# Patient Record
Sex: Female | Born: 2000 | Race: Black or African American | Hispanic: No | Marital: Single | Smoking: Never smoker
Health system: Southern US, Community
[De-identification: ages and names within clinical notes are randomized; demographics above are authoritative.]

## PROBLEM LIST (undated history)

## (undated) DIAGNOSIS — I10 Essential (primary) hypertension: Secondary | ICD-10-CM

## (undated) DIAGNOSIS — T7840XA Allergy, unspecified, initial encounter: Secondary | ICD-10-CM

## (undated) HISTORY — PX: NO PAST SURGERIES: SHX2092

---
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
## 2021-01-30 ENCOUNTER — Telehealth (INDEPENDENT_AMBULATORY_CARE_PROVIDER_SITE_OTHER): Payer: BC Managed Care – PPO | Admitting: Gastroenterology

## 2021-01-30 DIAGNOSIS — Z91199 Patient's noncompliance with other medical treatment and regimen due to unspecified reason: Secondary | ICD-10-CM

## 2021-01-30 DIAGNOSIS — Z5329 Procedure and treatment not carried out because of patient's decision for other reasons: Secondary | ICD-10-CM

## 2021-01-30 NOTE — Progress Notes (Signed)
Pt not answering phone and has not logged on for her visit. Voicemail was left for her to call us back

## 2021-03-05 ENCOUNTER — Ambulatory Visit
Admission: EM | Admit: 2021-03-05 | Discharge: 2021-03-05 | Disposition: A | Discharge status: Home / Self Care | Payer: BC Managed Care – PPO | Attending: Internal Medicine | Admitting: Internal Medicine

## 2021-03-05 ENCOUNTER — Encounter: Payer: Self-pay | Admitting: Emergency Medicine

## 2021-03-05 ENCOUNTER — Other Ambulatory Visit: Payer: Self-pay

## 2021-03-05 DIAGNOSIS — R000 Tachycardia, unspecified: Secondary | ICD-10-CM | POA: Diagnosis not present

## 2021-03-05 DIAGNOSIS — R42 Dizziness and giddiness: Secondary | ICD-10-CM | POA: Diagnosis not present

## 2021-03-05 DIAGNOSIS — R1110 Vomiting, unspecified: Secondary | ICD-10-CM

## 2021-03-05 DIAGNOSIS — R002 Palpitations: Secondary | ICD-10-CM

## 2021-03-05 NOTE — ED Triage Notes (Signed)
Patient c/o dizziness, blurred vision in her right eye, elevated heart rate x 1 week. She states she vomited twice today.

## 2021-03-05 NOTE — ED Provider Notes (Signed)
[facility]    CSN: [id number] Arrival date & time: 03/05/21  1555      History   Chief Complaint Chief Complaint  Patient presents with  . Dizziness  . Blurred Vision  . Tachycardia    HPI Kristie Morrow is a 20 y.o. female who presents  Due to havein dizziness and blurred vision in her R eye, her pulse was elevated off and on for an entire day x 1 week. There are days She vomited x 2 today after an episode this am. She does get a HA with this.  Pulse goes up from 110-115 while resting and during this time feels dizzy. There have been days up to a couple is gone. LMP 3 weeks ago Has been on Omeprazole x 2 months    Past Medical History:  Diagnosis Date  . Allergy    SEASONAL     Patient Active Problem List   Diagnosis Date Noted  . Gastroesophageal reflux disease   . Esophageal dysphagia   . Acute esophagitis   . Osgood-Schlatter's disease of right lower extremity 11/02/2018    Past Surgical History:  Procedure Laterality Date  . ESOPHAGOGASTRODUODENOSCOPY (EGD) WITH PROPOFOL N/A 11/08/2020   Procedure: ESOPHAGOGASTRODUODENOSCOPY (EGD) WITH PROPOFOL;  Surgeon: Pasty Spillers, MD;  Location: [facility] ENDOSCOPY;  Service: Endoscopy;  Laterality: N/A;  . EXCISION OF ACCESSORY OSSICLE Right 04/21/2019   Procedure: EXCISION OF A SYMPTOMATIC OSSICLE OF TIBIAL TUBERCLE WITH POSSIBLE REPAIR OF PATELLA TENDONOSSICLE RIGHT;  Surgeon: Christena Flake, MD;  Location: [facility] ORS;  Service: Orthopedics;  Laterality: Right;  . NO PAST SURGERIES      OB History   No obstetric history on file.      Home Medications    Prior to Admission medications   Medication Sig Start Date End Date Taking? Authorizing Provider  omeprazole (PRILOSEC) 40 MG capsule Take 1 capsule (40 mg total) by mouth daily. 11/15/20  Yes Tahiliani, Dolphus Jenny, MD  albuterol (VENTOLIN HFA) 108 (90 Base) MCG/ACT inhaler Inhale 1-2 puffs into the lungs every 6 (six) hours as needed for wheezing or  shortness of breath.  03/05/21  [provider]  montelukast (SINGULAIR) 5 MG chewable tablet Chew 5 mg by mouth at bedtime.  03/05/21  [provider]    Family History History reviewed. No pertinent family history.  Social History Social History   Tobacco Use  . Smoking status: Never Smoker  . Smokeless tobacco: Never Used  Vaping Use  . Vaping Use: Never used  Substance Use Topics  . Alcohol use: No  . Drug use: Never     Allergies   Patient has no known allergies.   Review of Systems Review of Systems  Constitutional: Negative for appetite change, chills, diaphoresis, fatigue and fever.  HENT: Negative for congestion.   Eyes: Negative for discharge.  Respiratory: Negative for cough, chest tightness and shortness of breath.   Cardiovascular: Positive for palpitations. Negative for chest pain.  Gastrointestinal: Positive for vomiting. Negative for abdominal pain and nausea.  Genitourinary: Negative for menstrual problem.  Musculoskeletal: Negative for myalgias.  Skin: Negative for rash.  Neurological: Positive for headaches.     Physical Exam Triage Vital Signs ED Triage Vitals  Enc Vitals Group     BP 03/05/21 1646 125/61     Pulse Rate 03/05/21 1646 71     Resp 03/05/21 1646 18     Temp 03/05/21 1646 98.8 F (37.1 C)     Temp  Source 03/05/21 1646 Oral     SpO2 03/05/21 1646 99 %     Weight 03/05/21 1645 157 lb (71.2 kg)     Height 03/05/21 1645 5\' 3"  (1.6 m)     Head Circumference --      Peak Flow --      Pain Score 03/05/21 1645 0     Pain Loc --      Pain Edu? --      Excl. in GC? --    No data found.  Updated Vital Signs BP 125/61 (BP Location: Left Arm)   Pulse 71   Temp 98.8 F (37.1 C) (Oral)   Resp 18   Ht 5\' 3"  (1.6 m)   Wt 157 lb (71.2 kg)   LMP 02/19/2021   SpO2 99%   BMI 27.81 kg/m   Visual Acuity Right Eye Distance: 20/20 corrected Left Eye Distance: 20/20 corrected  Bilateral Distance: 20/20  corrected  Right Eye Near:   Left Eye Near:    Bilateral Near:       Physical Exam Vitals signs and nursing note reviewed.  Constitutional:      General: He is not in acute distress.    Appearance: He is well-developed and normal weight. He is not ill-appearing, toxic-appearing or diaphoretic.  HENT:     Head: Normocephalic. TM's gray and shinyEyes:     Extraocular Movements: Extraocular movements intact.     Pupils: Pupils are equal, round, and reactive to light.  Neck:     Musculoskeletal: Neck supple. No neck rigidity.     Meningeal: Brudzinski's sign absent.  Cardiovascular:     Rate and Rhythm: Normal rate and regular rhythm.     Heart sounds: No murmur. Mild arrhythmia noted with respirations only Pulmonary:     Effort: Pulmonary effort is normal.     Breath sounds: Normal breath sounds. No wheezing, rhonchi or rales.  Abdominal:     General: Bowel sounds are normal.     Palpations: Abdomen is soft. There is no mass.     Tenderness: There is no abdominal tenderness. There is no guarding.  Musculoskeletal: Normal range of motion.  Lymphadenopathy:     Cervical: No cervical adenopathy.  Skin:    General: Skin is warm and dry.  Neurological:     Mental Status: He is alert.     Cranial Nerves: No cranial nerve deficit or facial asymmetry.     Sensory: No sensory deficit.     Motor: No weakness.     Coordination: Romberg sign negative. Coordination normal.     Gait: Gait normal.     Deep Tendon Reflexes: Reflexes normal.     Comments: Normal Romberg, propioception, finger to nose, tandem gait.   Psychiatric:        Mood and Affect: Mood normal.        Speech: Speech normal.        Behavior: Behavior normal.    UC Treatments / Results  Labs (all labs ordered are listed, but only abnormal results are displayed) Labs Reviewed - No data to display  EKG Normal sinus rhythm with sinus arrhythmia Radiology No results found.  Procedures Procedures (including  critical care time)  Medications Ordered in UC Medications - No data to display  Initial Impression / Assessment and Plan / UC Course  I have reviewed the triage vital signs and the nursing notes. Pertinent  imaging results that were available during my care of the patient were reviewed  by me and considered in my medical decision making (see chart for details). Has intermittent tachycardia of unknown cause Since we dont have lab tonight, I ordered them as out pt. CBC, CMP, magnesium, TSh with Free t4. Needs to Fu with her PCP We will inform her when her labs are back.  Final Clinical Impressions(s) / UC Diagnoses   Final diagnoses:  Dizziness  Palpitations  Tachycardia     Discharge Instructions     Your EKG is normal. Come back in the morning to have lab work done to check your cell count chemistry, magnesium and thyroid.  Make appointment with your family Doctor for follow up.      ED Prescriptions    None     PDMP not reviewed this encounter.   Garey Ham, PA-C 03/05/21 1801

## 2021-03-05 NOTE — Discharge Instructions (Addendum)
Your EKG is normal. Come back in the morning to have lab work done to check your cell count chemistry, magnesium and thyroid.  Make appointment with your family Doctor for follow up.

## 2021-03-06 ENCOUNTER — Other Ambulatory Visit
Admission: RE | Admit: 2021-03-06 | Discharge: 2021-03-06 | Disposition: A | Discharge status: Home / Self Care | Payer: BC Managed Care – PPO | Attending: Internal Medicine | Admitting: Internal Medicine

## 2021-03-06 DIAGNOSIS — R1110 Vomiting, unspecified: Secondary | ICD-10-CM | POA: Insufficient documentation

## 2021-03-06 DIAGNOSIS — H538 Other visual disturbances: Secondary | ICD-10-CM | POA: Insufficient documentation

## 2021-03-06 DIAGNOSIS — R42 Dizziness and giddiness: Secondary | ICD-10-CM | POA: Insufficient documentation

## 2021-03-06 DIAGNOSIS — R002 Palpitations: Secondary | ICD-10-CM | POA: Insufficient documentation

## 2021-03-06 DIAGNOSIS — R000 Tachycardia, unspecified: Secondary | ICD-10-CM | POA: Diagnosis not present

## 2021-03-06 LAB — CBC
HCT: 37.6 % (ref 36.0–46.0)
Hemoglobin: 11.9 g/dL — ABNORMAL LOW (ref 12.0–15.0)
MCH: 25.6 pg — ABNORMAL LOW (ref 26.0–34.0)
MCHC: 31.6 g/dL (ref 30.0–36.0)
MCV: 81 fL (ref 80.0–100.0)
Platelets: 442 10*3/uL — ABNORMAL HIGH (ref 150–400)
RBC: 4.64 MIL/uL (ref 3.87–5.11)
RDW: 13.7 % (ref 11.5–15.5)
WBC: 10.8 10*3/uL — ABNORMAL HIGH (ref 4.0–10.5)
nRBC: 0 % (ref 0.0–0.2)

## 2021-03-06 LAB — COMPREHENSIVE METABOLIC PANEL
ALT: 17 U/L (ref 0–44)
AST: 23 U/L (ref 15–41)
Albumin: 4.3 g/dL (ref 3.5–5.0)
Alkaline Phosphatase: 62 U/L (ref 38–126)
Anion gap: 7 (ref 5–15)
BUN: 15 mg/dL (ref 6–20)
CO2: 23 mmol/L (ref 22–32)
Calcium: 9.3 mg/dL (ref 8.9–10.3)
Chloride: 105 mmol/L (ref 98–111)
Creatinine, Ser: 0.85 mg/dL (ref 0.44–1.00)
GFR, Estimated: >60 mL/min (ref >60)
Glucose, Bld: 91 mg/dL (ref 70–99)
Potassium: 3.9 mmol/L (ref 3.5–5.1)
Sodium: 135 mmol/L (ref 135–145)
Total Bilirubin: 0.5 mg/dL (ref 0.3–1.2)
Total Protein: 7.5 g/dL (ref 6.5–8.1)

## 2021-03-06 LAB — MAGNESIUM: Magnesium: 1.8 mg/dL (ref 1.7–2.4)

## 2021-03-06 LAB — TSH: TSH: 1.284 u[IU]/mL (ref 0.350–4.500)

## 2021-03-06 LAB — T4, FREE: Free T4: 0.72 ng/dL (ref 0.61–1.12)

## 2021-03-30 ENCOUNTER — Other Ambulatory Visit: Payer: Self-pay | Admitting: Pediatrics

## 2021-03-30 DIAGNOSIS — I159 Secondary hypertension, unspecified: Secondary | ICD-10-CM

## 2021-04-23 ENCOUNTER — Ambulatory Visit: Admission: RE | Admit: 2021-04-23 | Payer: BC Managed Care – PPO | Source: Ambulatory Visit

## 2021-04-23 ENCOUNTER — Other Ambulatory Visit: Payer: Self-pay

## 2021-04-23 ENCOUNTER — Ambulatory Visit
Admission: RE | Admit: 2021-04-23 | Discharge: 2021-04-23 | Disposition: A | Discharge status: Home / Self Care | Payer: BC Managed Care – PPO | Source: Ambulatory Visit | Attending: Pediatrics | Admitting: Pediatrics

## 2021-04-23 DIAGNOSIS — I159 Secondary hypertension, unspecified: Secondary | ICD-10-CM | POA: Diagnosis not present

## 2021-05-03 ENCOUNTER — Ambulatory Visit: Payer: BC Managed Care – PPO

## 2021-05-03 ENCOUNTER — Other Ambulatory Visit: Payer: BC Managed Care – PPO

## 2021-06-02 ENCOUNTER — Other Ambulatory Visit: Payer: Self-pay

## 2021-08-26 ENCOUNTER — Telehealth: Payer: Self-pay | Admitting: Physician Assistant

## 2021-08-26 DIAGNOSIS — Z91199 Patient's noncompliance with other medical treatment and regimen due to unspecified reason: Secondary | ICD-10-CM

## 2021-08-26 NOTE — Progress Notes (Signed)
Pt was not able to connect during appt time. Called pt , got VM, left her VM. Also resent her video link on text on provided number   The patient no-showed for appointment despite this provider sending direct link, reaching out via phone with no response and waiting for at least 10 minutes from appointment time for patient to join. They will be marked as a NS for this appointment/time.   Demetrio Lapping, PA-C

## 2021-08-27 ENCOUNTER — Other Ambulatory Visit: Payer: Self-pay

## 2021-08-27 ENCOUNTER — Telehealth: Payer: Self-pay

## 2021-08-27 ENCOUNTER — Emergency Department
Admission: EM | Admit: 2021-08-27 | Discharge: 2021-08-27 | Disposition: A | Discharge status: Home / Self Care | Payer: BC Managed Care – PPO | Attending: Emergency Medicine | Admitting: Emergency Medicine

## 2021-08-27 ENCOUNTER — Emergency Department: Payer: BC Managed Care – PPO

## 2021-08-27 DIAGNOSIS — R519 Headache, unspecified: Secondary | ICD-10-CM | POA: Diagnosis present

## 2021-08-27 DIAGNOSIS — I10 Essential (primary) hypertension: Secondary | ICD-10-CM | POA: Insufficient documentation

## 2021-08-27 DIAGNOSIS — Z79899 Other long term (current) drug therapy: Secondary | ICD-10-CM | POA: Insufficient documentation

## 2021-08-27 LAB — BASIC METABOLIC PANEL
Anion gap: 8 (ref 5–15)
BUN: 16 mg/dL (ref 6–20)
CO2: 24 mmol/L (ref 22–32)
Calcium: 9.2 mg/dL (ref 8.9–10.3)
Chloride: 102 mmol/L (ref 98–111)
Creatinine, Ser: 0.81 mg/dL (ref 0.44–1.00)
GFR, Estimated: >60 mL/min (ref >60)
Glucose, Bld: 91 mg/dL (ref 70–99)
Potassium: 3.7 mmol/L (ref 3.5–5.1)
Sodium: 134 mmol/L — ABNORMAL LOW (ref 135–145)

## 2021-08-27 LAB — CBC
HCT: 41 % (ref 36.0–46.0)
Hemoglobin: 13 g/dL (ref 12.0–15.0)
MCH: 25.7 pg — ABNORMAL LOW (ref 26.0–34.0)
MCHC: 31.7 g/dL (ref 30.0–36.0)
MCV: 81.2 fL (ref 80.0–100.0)
Platelets: 468 10*3/uL — ABNORMAL HIGH (ref 150–400)
RBC: 5.05 MIL/uL (ref 3.87–5.11)
RDW: 13.2 % (ref 11.5–15.5)
WBC: 10.7 10*3/uL — ABNORMAL HIGH (ref 4.0–10.5)
nRBC: 0 % (ref 0.0–0.2)

## 2021-08-27 LAB — POC URINE PREG, ED: Preg Test, Ur: NEGATIVE

## 2021-08-27 MED ORDER — DIPHENHYDRAMINE HCL 50 MG/ML IJ SOLN
50.0000 mg | Freq: Once | INTRAMUSCULAR | Status: AC
  Administered 2021-08-27: 18:00:00 50 mg via INTRAVENOUS
  Filled 2021-08-27: qty 1

## 2021-08-27 MED ORDER — SODIUM CHLORIDE 0.9 % IV BOLUS
1000.0000 mL | Freq: Once | INTRAVENOUS | Status: AC
  Administered 2021-08-27: 18:00:00 1000 mL via INTRAVENOUS

## 2021-08-27 MED ORDER — METOCLOPRAMIDE HCL 5 MG/ML IJ SOLN
10.0000 mg | Freq: Once | INTRAMUSCULAR | Status: AC
  Administered 2021-08-27: 18:00:00 10 mg via INTRAVENOUS
  Filled 2021-08-27: qty 2

## 2021-08-27 MED ORDER — KETOROLAC TROMETHAMINE 30 MG/ML IJ SOLN
30.0000 mg | Freq: Once | INTRAMUSCULAR | Status: AC
  Administered 2021-08-27: 18:00:00 30 mg via INTRAVENOUS
  Filled 2021-08-27: qty 1

## 2021-08-27 NOTE — ED Notes (Signed)
D/c IV after fluids completed

## 2021-08-27 NOTE — ED Provider Notes (Signed)
[facility]EMER[facility]ENCY DEPARTMENT  ____________________________________________  Time seen: Approximately 5:19 PM  I have reviewed the triage vital signs and the nursing notes.   HISTORY  Chief Complaint Headache and Hypertension   Historian Patient and Mother     HPI Kristie Morrow is a 20 y.o. female presents to the emergency department with a left-sided frontal headache that has occurred constantly for the past 2 weeks.  Patient states that pain came on gradually and progressed in intensity gradually.  Patient denies a history of migraines in the past.  She denies falls or mechanisms of trauma.  No changes in caffeine intake.  No major dietary changes.  She reports that she has been trying to stay hydrated at home.  Her next eye exam is in December.  She states that she does wear contact lenses but switched her glasses to see if that affected her headache and she had no improvement.  She states that she is taken Tylenol at home and her symptoms have not improved.  She states that she takes losartan hydrochlorothiazide for hypertension which she started in the summertime without complication.  She denies chest pain, chest tightness or shortness of breath.  No viral URI-like symptoms or other recent illness.  She has been afebrile at home.  She denies possibility of pregnancy and states that she is currently menstruating.  States that she does not normally have headaches around her period.   Past Medical History:  Diagnosis Date   Allergy    SEASONAL      Immunizations up to date:  Yes.     Past Medical History:  Diagnosis Date   Allergy    SEASONAL     Patient Active Problem List   Diagnosis Date Noted   Gastroesophageal reflux disease    Esophageal dysphagia    Acute esophagitis    Osgood-Schlatter's disease of right lower extremity 11/02/2018    Past Surgical History:  Procedure Laterality Date   ESOPHAGOGASTRODUODENOSCOPY (EGD) WITH PROPOFOL N/A 11/08/2020    Procedure: ESOPHAGOGASTRODUODENOSCOPY (EGD) WITH PROPOFOL;  Surgeon: Pasty Spillers, MD;  Location: [facility] END[facility];  Service: Endoscopy;  Laterality: N/A;   EXCISION OF ACCESSORY OSSICLE Right 04/21/2019   Procedure: EXCISION OF A SYMPTOMATIC OSSICLE OF TIBIAL TUBERCLE WITH POSSIBLE REPAIR OF PATELLA TENDONOSSICLE RIGHT;  Surgeon: Christena Flake, MD;  Location: [facility] ORS;  Service: Orthopedics;  Laterality: Right;   NO PAST SURGERIES      Prior to Admission medications   Medication Sig Start Date End Date Taking? Authorizing Provider  omeprazole (PRILOSEC) 40 MG capsule Take 1 capsule (40 mg total) by mouth daily. 11/15/20   Pasty Spillers, MD  albuterol (VENTOLIN HFA) 108 (90 Base) MCG/ACT inhaler Inhale 1-2 puffs into the lungs every 6 (six) hours as needed for wheezing or shortness of breath.  03/05/21  [provider]  montelukast (SINGULAIR) 5 MG chewable tablet Chew 5 mg by mouth at bedtime.  03/05/21  [provider]    Allergies Patient has no known allergies.  No family history on file.  Social History Social History   Tobacco Use   Smoking status: Never   Smokeless tobacco: Never  Vaping Use   Vaping Use: Never used  Substance Use Topics   Alcohol use: No   Drug use: Never     Review of Systems  Constitutional: No fever/chills Eyes:  No discharge ENT: No upper respiratory complaints. Respiratory: no cough. No SOB/ use of accessory muscles to breath Gastrointestinal:   No  nausea, no vomiting.  No diarrhea.  No constipation. Musculoskeletal: Negative for musculoskeletal pain. Skin: Negative for rash, abrasions, lacerations, ecchymosis.    ____________________________________________   PHYSICAL EXAM:  VITAL SIGNS: ED Triage Vitals [08/27/21 1526]  Enc Vitals Group     BP 131/83     Pulse Rate 73     Resp 18     Temp 98 F (36.7 C)     Temp Source Oral     SpO2 97 %     Weight 158 lb (71.7 kg)     Height 5\' 3"  (1.6 m)     Head  Circumference      Peak Flow      Pain Score 6     Pain Loc      Pain Edu?      Excl. in GC?      Constitutional: Alert and oriented. Well appearing and in no acute distress. Eyes: Conjunctivae are normal. PERRL. EOMI. Head: Atraumatic. ENT:      Ears:       Nose: No congestion/rhinnorhea.      Mouth/Throat: Mucous membranes are moist.  Neck: No stridor.  No cervical spine tenderness to palpation. Cardiovascular: Normal rate, regular rhythm. Normal S1 and S2.  Good peripheral circulation. Respiratory: Normal respiratory effort without tachypnea or retractions. Lungs CTAB. Good air entry to the bases with no decreased or absent breath sounds Gastrointestinal: Bowel sounds x 4 quadrants. Soft and nontender to palpation. No guarding or rigidity. No distention. Musculoskeletal: Full range of motion to all extremities. No obvious deformities noted Neurologic:  Normal for age. No gross focal neurologic deficits are appreciated.  Skin:  Skin is warm, dry and intact. No rash noted. Psychiatric: Mood and affect are normal for age. Speech and behavior are normal.   ____________________________________________   LABS (all labs ordered are listed, but only abnormal results are displayed)  Labs Reviewed  CBC - Abnormal; Notable for the following components:      Result Value   WBC 10.7 (*)    MCH 25.7 (*)    Platelets 468 (*)    All other components within normal limits  BASIC METABOLIC PANEL - Abnormal; Notable for the following components:   Sodium 134 (*)    All other components within normal limits  POC URINE PREG, ED   ____________________________________________  EKG   ____________________________________________  RADIOLOGY , personally viewed and evaluated these images (plain radiographs) as part of my medical decision making, as well as reviewing the written report by the radiologist.  CT HEAD WO CONTRAST (Geraldo Pitter)  Result Date: 08/27/2021 CLINICAL DATA:   Dizziness, headache, blurred vision. EXAM: CT HEAD WITHOUT CONTRAST TECHNIQUE: Contiguous axial images were obtained from the base of the skull through the vertex without intravenous contrast. COMPARISON:  None. FINDINGS: Brain: No evidence of acute infarction, hemorrhage, hydrocephalus, extra-axial collection or mass lesion/mass effect. Vascular: No hyperdense vessel or unexpected calcification. Skull: Normal. Negative for fracture or focal lesion. Sinuses/Orbits: No acute finding. Other: None. IMPRESSION: No acute intracranial abnormality seen. Electronically Signed   By: 08/29/2021 M.D.   On: 08/27/2021 16:05    ____________________________________________    PROCEDURES  Procedure(s) performed:     Procedures     Medications  sodium chloride 0.9 % bolus 1,000 mL (0 mLs Intravenous Stopped 08/27/21 1914)  ketorolac (TORADOL) 30 MG/ML injection 30 mg (30 mg Intravenous Given 08/27/21 1745)  diphenhydrAMINE (BENADRYL) injection 50 mg (50 mg Intravenous Given 08/27/21 1745)  metoCLOPramide (REGLAN) injection 10 mg (10 mg Intravenous Given 08/27/21 1745)     ____________________________________________   INITIAL IMPRESSION / ASSESSMENT AND PLAN / ED COURSE  Pertinent labs & imaging results that were available during my care of the patient were reviewed by me and considered in my medical decision making (see chart for details).      Assessment and plan Headache 20 year old female presents to the emergency department with left-sided frontal headache with some blurry vision that is occurred intermittently over the past 2 weeks.  Vital signs are reassuring at triage.  On physical exam, patient was alert, active and nontoxic-appearing.  Patient's head CT showed no evidence of acute abnormality.  She received a migraine cocktail and she reported that her symptoms have improved significantly.     ____________________________________________  FINAL CLINICAL IMPRESSION(S) / ED  DIAGNOSES  Final diagnoses:  Acute nonintractable headache, unspecified headache type      NEW MEDICATIONS STARTED DURING THIS VISIT:  ED Discharge Orders     None           This chart was dictated using voice recognition software/Dragon. Despite best efforts to proofread, errors can occur which can change the meaning. Any change was purely unintentional.     Orvil Feil, PA-C 08/27/21 2241    Delton Prairie, MD 08/28/21 1125

## 2021-08-27 NOTE — ED Triage Notes (Signed)
Pt here with hypertension, HA, and a blurred vision. Pt takes medication for the same. Pt denies N/V/D. Pt in NAD in triage. Pt here with family member.

## 2021-08-27 NOTE — ED Notes (Signed)
Pt complains of headache to the left side and dizziness.

## 2021-08-29 ENCOUNTER — Telehealth: Payer: Self-pay | Admitting: Family

## 2021-08-29 DIAGNOSIS — I159 Secondary hypertension, unspecified: Secondary | ICD-10-CM

## 2021-08-29 DIAGNOSIS — Z09 Encounter for follow-up examination after completed treatment for conditions other than malignant neoplasm: Secondary | ICD-10-CM

## 2021-08-29 DIAGNOSIS — R519 Headache, unspecified: Secondary | ICD-10-CM

## 2021-08-29 MED ORDER — SUMATRIPTAN SUCCINATE 100 MG PO TABS: 100.0000 mg | ORAL_TABLET | ORAL | 0 refills | Status: DC | PRN

## 2021-08-29 NOTE — Progress Notes (Signed)
Virtual Visit  Note Due to COVID-19 pandemic this visit was conducted virtually. This visit type was conducted due to national recommendations for restrictions regarding the COVID-19 Pandemic (e.g. social distancing, sheltering in place) in an effort to limit this patient's exposure and mitigate transmission in our community. All issues noted in this document were discussed and addressed.  A physical exam was not performed with this format.  I connected with [name] on 08/29/21 at 9:40 AM  by telephone and verified that I am speaking with the correct person using two identifiers. AYRIS CARANO is currently located at school and no one is currently with her during visit. The provider, Jannifer Rodney, FNP is located in their office at time of visit.  I discussed the limitations, risks, security and privacy concerns of performing an evaluation and management service by telephone and the availability of in person appointments. I also discussed with the patient that there may be a patient responsible charge related to this service. The patient expressed understanding and agreed to proceed.  Ms. Criscione,you are scheduled for a virtual visit with your provider today.    Just as we do with appointments in the office, we must obtain your consent to participate.  Your consent will be active for this visit and any virtual visit you may have with one of our providers in the next 365 days.    If you have a MyChart account, I can also send a copy of this consent to you electronically.  All virtual visits are billed to your insurance company just like a traditional visit in the office.  As this is a virtual visit, video technology does not allow for your provider to perform a traditional examination.  This may limit your provider's ability to fully assess your condition.  If your provider identifies any concerns that need to be evaluated in person or the need to arrange testing such as labs, EKG, etc,  we will make arrangements to do so.    Although advances in technology are sophisticated, we cannot ensure that it will always work on either your end or our end.  If the connection with a video visit is poor, we may have to switch to a telephone visit.  With either a video or telephone visit, we are not always able to ensure that we have a secure connection.   I need to obtain your verbal consent now.   Are you willing to proceed with your visit today?   SABRIN DUNLEVY has provided verbal consent on 08/29/2021 for a virtual visit (video or telephone).   Jannifer Rodney, [place] 08/29/2021  9:53 AM   History and Present Illness:  Pt calls today with complaints of headache. She went to the ED on 08/27/21 and had a negative CT scan. She was diagnosed with a migraine. She was given a "migraine cocktail" and felt better. However, last night her headache returned.   She has an appointment scheduled with her new PCP on 09/05/21.  Denies any hx of migraines.  Headache  This is a new problem. The current episode started 1 to 4 weeks ago. The problem has been waxing and waning. The pain is located in the Left unilateral and occipital region. The pain quality is not similar to prior headaches. Quality: tightness and pressure. The pain is at a severity of 7/10. Associated symptoms include blurred vision, phonophobia and photophobia. Pertinent negatives include no eye watering, loss of balance or nausea. Her past medical history  is significant for hypertension.  Hypertension This is a chronic problem. The current episode started more than 1 year ago. The problem has been resolved since onset. Associated symptoms include blurred vision and headaches. Pertinent negatives include no malaise/fatigue, peripheral edema or shortness of breath. Past treatments include angiotensin blockers. The current treatment provides moderate improvement.     Review of Systems  Constitutional:  Negative for malaise/fatigue.   Eyes:  Positive for blurred vision and photophobia.  Respiratory:  Negative for shortness of breath.   Gastrointestinal:  Negative for nausea.  Neurological:  Positive for headaches. Negative for loss of balance.    Observations/Objective: No SOB or distress noted, A&O X4  Assessment and Plan: 1. Acute intractable headache, unspecified headache type Will give Imitrex as needed Continue tylenol as needed Keep Follow up with PCP Stress management  Headache journal  Encourage 8 hours of sleep - SUMAtriptan (IMITREX) 100 MG tablet; Take 1 tablet (100 mg total) by mouth every 2 (two) hours as needed for migraine. May repeat in 2 hours if headache persists or recurs.  Dispense: 10 tablet; Refill: 0  2. Secondary hypertension  3. Hospital discharge follow-up Hospital notes reviewed     I discussed the assessment and treatment plan with the patient. The patient was provided an opportunity to ask questions and all were answered. The patient agreed with the plan and demonstrated an understanding of the instructions.   The patient was advised to call back or seek an in-person evaluation if the symptoms worsen or if the condition fails to improve as anticipated.  The above assessment and management plan was discussed with the patient. The patient verbalized understanding of and has agreed to the management plan. Patient is aware to call the clinic if symptoms persist or worsen. Patient is aware when to return to the clinic for a follow-up visit. Patient educated on when it is appropriate to go to the emergency department.   Time call ended: 10:03 AM    I provided 23 minutes of  non face-to-face time during this encounter.    Jannifer Rodney, FNP

## 2021-09-05 ENCOUNTER — Ambulatory Visit: Payer: Self-pay | Admitting: Internal Medicine

## 2021-09-28 ENCOUNTER — Other Ambulatory Visit: Payer: Self-pay | Admitting: Family

## 2021-09-28 DIAGNOSIS — R519 Headache, unspecified: Secondary | ICD-10-CM

## 2021-11-29 IMAGING — CR DG CHEST 2V
2 series · 2 of 2 positions shown · non-contrast
Comparison: Radiograph 07/30/2019

CLINICAL DATA: Chest pain, midsternal chest pain radiating to the
back for 3 days

EXAM:
CHEST - 2 VIEW

[chest pa]
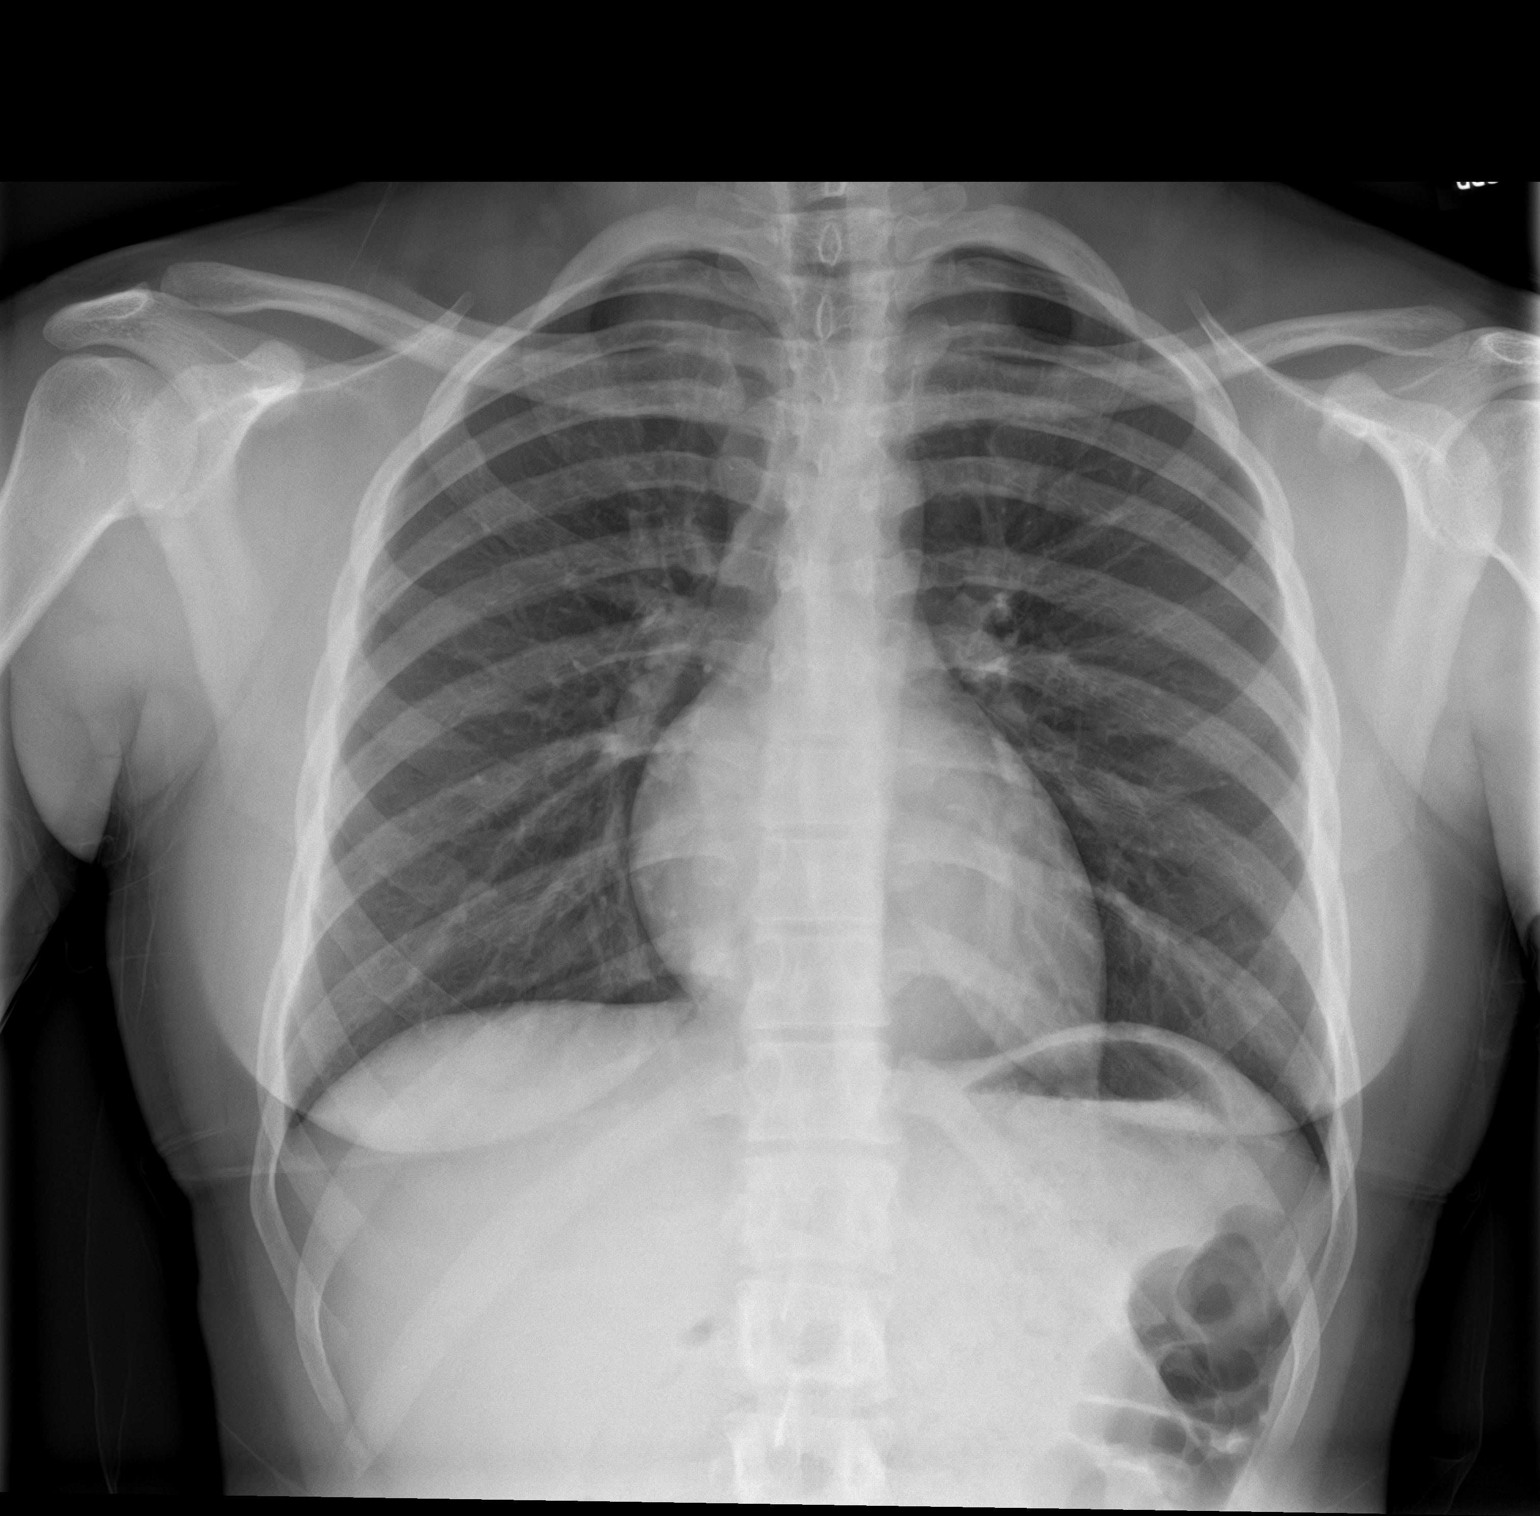

[chest lat]
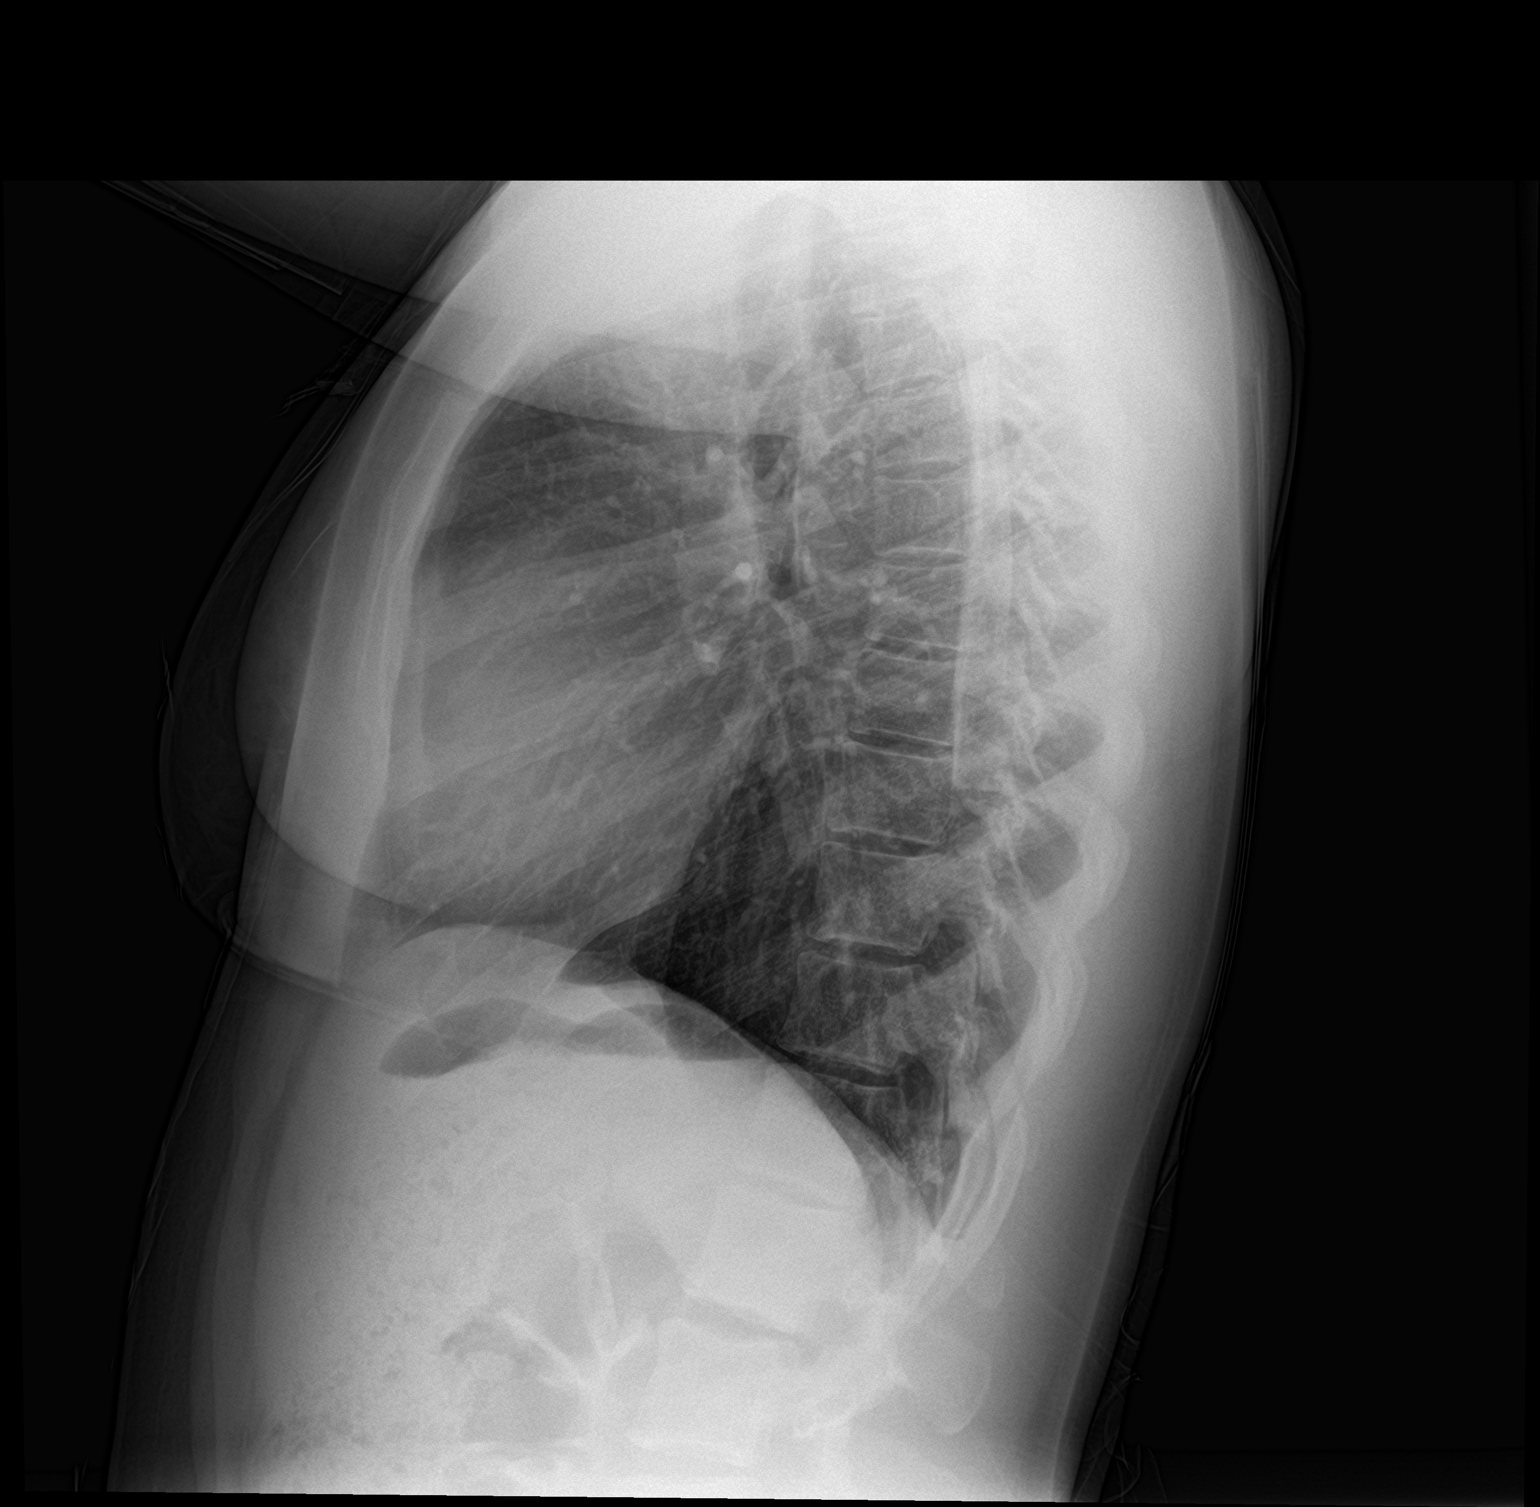

[2 of 2 positions shown; findings below may reference images not displayed]

FINDINGS: No consolidation, features of edema, pneumothorax, or effusion.
Pulmonary vascularity is normally distributed. The cardiomediastinal
contours are unremarkable. No acute osseous or soft tissue
abnormality.
IMPRESSION: No acute cardiopulmonary abnormality.

## 2021-12-05 ENCOUNTER — Ambulatory Visit: Payer: Self-pay | Admitting: Internal Medicine

## 2022-02-21 ENCOUNTER — Other Ambulatory Visit: Payer: Self-pay

## 2022-02-21 ENCOUNTER — Encounter: Payer: Self-pay | Admitting: Emergency Medicine

## 2022-02-21 ENCOUNTER — Ambulatory Visit
Admission: EM | Admit: 2022-02-21 | Discharge: 2022-02-21 | Disposition: A | Discharge status: Home / Self Care | Payer: BC Managed Care – PPO | Attending: Emergency Medicine | Admitting: Emergency Medicine

## 2022-02-21 ENCOUNTER — Telehealth: Payer: Self-pay | Admitting: Family Medicine

## 2022-02-21 DIAGNOSIS — R42 Dizziness and giddiness: Secondary | ICD-10-CM

## 2022-02-21 DIAGNOSIS — G43009 Migraine without aura, not intractable, without status migrainosus: Secondary | ICD-10-CM

## 2022-02-21 HISTORY — DX: Essential (primary) hypertension: I10

## 2022-02-21 MED ORDER — KETOROLAC TROMETHAMINE 60 MG/2ML IM SOLN
60.0000 mg | Freq: Once | INTRAMUSCULAR | Status: AC
  Administered 2022-02-21: 60 mg via INTRAMUSCULAR

## 2022-02-21 MED ORDER — NAPROXEN 500 MG PO TABS: 500.0000 mg | ORAL_TABLET | Freq: Two times a day (BID) | ORAL | 0 refills | Status: DC

## 2022-02-21 NOTE — ED Triage Notes (Signed)
Pt presents with HA, dizziness, light sensitivity and blurred vision since yesterday.

## 2022-02-21 NOTE — ED Provider Notes (Signed)
[facility]    CSN: [id number] Arrival date & time: 02/21/22  1305      History   Chief Complaint Chief Complaint  Patient presents with   Headache   Blurred Vision   Dizziness    HPI Kristie Morrow is a 21 y.o. female.   Patient presents today with left-sided headache since yesterday some light sensitivity and dizziness.  Patient states that she has had this in the past and was seen in the ER they expressed that it might be just migraine headaches.  Patient is transitioning from her pediatrician to her new PCP and has not seen a neurologist yet.  Patient denies any nausea vomiting or diarrhea.  Denies any urinary symptoms.  Has not taken anything prior to arrival.   Past Medical History:  Diagnosis Date   Allergy    SEASONAL    Hypertension     Patient Active Problem List   Diagnosis Date Noted   Gastroesophageal reflux disease    Esophageal dysphagia    Acute esophagitis    Osgood-Schlatter's disease of right lower extremity 11/02/2018    Past Surgical History:  Procedure Laterality Date   ESOPHAGOGASTRODUODENOSCOPY (EGD) WITH PROPOFOL N/A 11/08/2020   Procedure: ESOPHAGOGASTRODUODENOSCOPY (EGD) WITH PROPOFOL;  Surgeon: Pasty Spillers, MD;  Location: [facility];  Service: Endoscopy;  Laterality: N/A;   EXCISION OF ACCESSORY OSSICLE Right 04/21/2019   Procedure: EXCISION OF A SYMPTOMATIC OSSICLE OF TIBIAL TUBERCLE WITH POSSIBLE REPAIR OF PATELLA TENDONOSSICLE RIGHT;  Surgeon: Christena Flake, MD;  Location: [facility] OR[facility];  Service: Orthopedics;  Laterality: Right;   NO PAST SURGERIES      OB History   No obstetric history on file.      Home Medications    Prior to Admission medications   Medication Sig Start Date End Date Taking? Authorizing Provider  naproxen (NAPROSYN) 500 MG tablet Take 1 tablet (500 mg total) by mouth 2 (two) times daily. 02/21/22  Yes Coralyn Mark, NP  omeprazole (PRILOSEC) 40 MG capsule Take 1 capsule (40 mg  total) by mouth daily. 11/15/20   Pasty Spillers, MD  SUMAtriptan (IMITREX) 100 MG tablet Take 1 tablet (100 mg total) by mouth every 2 (two) hours as needed for migraine. May repeat in 2 hours if headache persists or recurs. 08/29/21   Junie Spencer, FNP  albuterol (VENTOLIN HFA) 108 (90 Base) MCG/ACT inhaler Inhale 1-2 puffs into the lungs every 6 (six) hours as needed for wheezing or shortness of breath.  03/05/21  [provider]  montelukast (SINGULAIR) 5 MG chewable tablet Chew 5 mg by mouth at bedtime.  03/05/21  [provider]    Family History No family history on file.  Social History Social History   Tobacco Use   Smoking status: Never   Smokeless tobacco: Never  Vaping Use   Vaping Use: Never used  Substance Use Topics   Alcohol use: No   Drug use: Never     Allergies   Patient has no known allergies.   Review of Systems Review of Systems  Constitutional: Negative.  Negative for fever.  HENT: Negative.    Eyes: Negative.   Respiratory: Negative.    Cardiovascular: Negative.   Gastrointestinal: Negative.   Genitourinary: Negative.   Musculoskeletal: Negative.   Neurological:  Positive for dizziness and headaches.    Physical Exam Triage Vital Signs ED Triage Vitals  Enc Vitals Group     BP 02/21/22 1404 134/86  Pulse Rate 02/21/22 1404 74     Resp 02/21/22 1404 18     Temp 02/21/22 1404 98.2 F (36.8 C)     Temp Source 02/21/22 1404 Oral     SpO2 02/21/22 1404 100 %     Weight --      Height --      Head Circumference --      Peak Flow --      Pain Score 02/21/22 1401 8     Pain Loc --      Pain Edu? --      Excl. in GC? --    No data found.  Updated Vital Signs BP 134/86 (BP Location: Left Arm)   Pulse 74   Temp 98.2 F (36.8 C) (Oral)   Resp 18   LMP 02/07/2022 (Approximate)   SpO2 100%   Visual Acuity Right Eye Distance:   Left Eye Distance:   Bilateral Distance:    Right Eye Near:   Left Eye Near:     Bilateral Near:     Physical Exam Constitutional:      Appearance: She is well-developed.  Eyes:     Extraocular Movements: Extraocular movements intact.  Cardiovascular:     Rate and Rhythm: Normal rate.  Pulmonary:     Effort: Pulmonary effort is normal.  Abdominal:     Palpations: Abdomen is soft.  Musculoskeletal:     Cervical back: Normal range of motion.  Skin:    General: Skin is warm.  Neurological:     Mental Status: She is alert.     GCS: GCS eye subscore is 4. GCS verbal subscore is 5. GCS motor subscore is 6.  Psychiatric:        Mood and Affect: Mood normal.        Behavior: Behavior normal.     UC Treatments / Results  Labs (all labs ordered are listed, but only abnormal results are displayed) Labs Reviewed - No data to display  EKG   Radiology No results found.  Procedures Procedures (including critical care time)  Medications Ordered in UC Medications  ketorolac (TORADOL) injection 60 mg (has no administration in time range)    Initial Impression / Assessment and Plan / UC Course  I have reviewed the triage vital signs and the nursing notes.  Pertinent labs & imaging results that were available during my care of the patient were reviewed by me and considered in my medical decision making (see chart for details).     Patient will need to follow-up with a neurologist to rule out migraines. Patient should take medicine as needed for headaches educated to eat to avoid nausea with this Educated patient steps to take to decrease pain such as an onlay room staying hydrated well avoiding caffeine.  Patient also states that her cycle is to come on in 3 days so she is not sure if these are related to time of menstrual. Final Clinical Impressions(s) / UC Diagnoses   Final diagnoses:  Dizziness  Migraine without aura and without status migrainosus, not intractable     Discharge Instructions      Symptoms become worse you need to be seen in the  emergency room Take the naproxen as needed for pain Make sure you stay hydrated well with clear fluids     ED Prescriptions     Medication Sig Dispense Auth. Provider   naproxen (NAPROSYN) 500 MG tablet Take 1 tablet (500 mg total) by mouth 2 (two)  times daily. 30 tablet Coralyn MarkMitchell, Blondine Hottel L, NP      PDMP not reviewed this encounter.   Coralyn MarkMitchell, Kalman Nylen L, NP 02/21/22 1445

## 2022-02-21 NOTE — Discharge Instructions (Addendum)
Symptoms become worse you need to be seen in the emergency room Take the naproxen as needed for pain Make sure you stay hydrated well with clear fluids

## 2022-02-21 NOTE — Progress Notes (Signed)
[place]   Unable to connect via video failure, given symptoms advised in person eval is needed.Pt agreed via chat box on vv feature.

## 2022-02-26 NOTE — Progress Notes (Deleted)
   LMP 02/07/2022 (Approximate)    Subjective:    Patient ID: Kristie Morrow, female    DOB: Nov 22, 2000, 21 y.o.   MRN: [id number]  HPI: Kristie Morrow is a 21 y.o. female  No chief complaint on file.  Patient presents to clinic to establish care with new PCP.  Introduced to Publishing rights manager role and practice setting.  All questions answered.  Discussed provider/patient relationship and expectations.  Patient reports a history of ***. Patient denies a history of: Hypertension, Elevated Cholesterol, Diabetes, Thyroid problems, Depression, Anxiety, Neurological problems, and Abdominal problems.   Active Ambulatory Problems    Diagnosis Date Noted   Osgood-Schlatter's disease of right lower extremity 11/02/2018   Gastroesophageal reflux disease    Esophageal dysphagia    Acute esophagitis    Resolved Ambulatory Problems    Diagnosis Date Noted   No Resolved Ambulatory Problems   Past Medical History:  Diagnosis Date   Allergy    Hypertension    Past Surgical History:  Procedure Laterality Date   ESOPHAGOGASTRODUODENOSCOPY (EGD) WITH PROPOFOL N/A 11/08/2020   Procedure: ESOPHAGOGASTRODUODENOSCOPY (EGD) WITH PROPOFOL;  Surgeon: Pasty Spillers, MD;  Location: [facility];  Service: Endoscopy;  Laterality: N/A;   EXCISION OF ACCESSORY OSSICLE Right 04/21/2019   Procedure: EXCISION OF A SYMPTOMATIC OSSICLE OF TIBIAL TUBERCLE WITH POSSIBLE REPAIR OF PATELLA TENDONOSSICLE RIGHT;  Surgeon: Christena Flake, MD;  Location: [facility];  Service: Orthopedics;  Laterality: Right;   NO PAST SURGERIES     No family history on file.   Review of Systems  Per HPI unless specifically indicated above     Objective:    LMP 02/07/2022 (Approximate)   Wt Readings from Last 3 Encounters:  08/27/21 158 lb (71.7 kg) (86 %, Z= 1.08)*  03/05/21 157 lb (71.2 kg) (86 %, Z= 1.09)*  11/08/20 157 lb (71.2 kg) (87 %, Z= 1.11)*   * Growth percentiles are based on CDC (Girls, 2-20 Years) data.     Physical Exam  Results for orders placed or performed during the hospital encounter of 08/27/21  CBC  Result Value Ref Range   WBC 10.7 (H) 4.0 - 10.5 K/uL   RBC 5.05 3.87 - 5.11 MIL/uL   Hemoglobin 13.0 12.0 - 15.0 g/dL   HCT 76.2 26.3 - 33.5 %   MCV 81.2 80.0 - 100.0 fL   MCH 25.7 (L) 26.0 - 34.0 pg   MCHC 31.7 30.0 - 36.0 g/dL   RDW 45.6 25.6 - 38.9 %   Platelets 468 (H) 150 - 400 K/uL   nRBC 0.0 0.0 - 0.2 %  Basic metabolic panel  Result Value Ref Range   Sodium 134 (L) 135 - 145 mmol/L   Potassium 3.7 3.5 - 5.1 mmol/L   Chloride 102 98 - 111 mmol/L   CO2 24 22 - 32 mmol/L   Glucose, Bld 91 70 - 99 mg/dL   BUN 16 6 - 20 mg/dL   Creatinine, Ser 3.73 0.44 - 1.00 mg/dL   Calcium 9.2 8.9 - 42.8 mg/dL   GFR, Estimated >76 >81 mL/min   Anion gap 8 5 - 15  POC Urine Pregnancy, ED  Result Value Ref Range   Preg Test, Ur NEGATIVE NEGATIVE      Assessment & Plan:   Problem List Items Addressed This Visit   None    Follow up plan: No follow-ups on file.

## 2022-02-27 ENCOUNTER — Ambulatory Visit: Payer: Self-pay

## 2022-02-27 ENCOUNTER — Ambulatory Visit (INDEPENDENT_AMBULATORY_CARE_PROVIDER_SITE_OTHER): Payer: BC Managed Care – PPO

## 2022-02-27 ENCOUNTER — Telehealth: Payer: Self-pay

## 2022-02-27 ENCOUNTER — Ambulatory Visit: Payer: Self-pay | Admitting: *Deleted

## 2022-02-27 ENCOUNTER — Encounter: Payer: Self-pay | Admitting: Emergency Medicine

## 2022-02-27 ENCOUNTER — Ambulatory Visit
Admission: EM | Admit: 2022-02-27 | Discharge: 2022-02-27 | Disposition: A | Discharge status: Home / Self Care | Payer: BC Managed Care – PPO | Attending: Physician Assistant | Admitting: Physician Assistant

## 2022-02-27 ENCOUNTER — Ambulatory Visit: Payer: Self-pay | Admitting: Nurse Practitioner

## 2022-02-27 DIAGNOSIS — R112 Nausea with vomiting, unspecified: Secondary | ICD-10-CM | POA: Diagnosis present

## 2022-02-27 DIAGNOSIS — R197 Diarrhea, unspecified: Secondary | ICD-10-CM | POA: Diagnosis not present

## 2022-02-27 DIAGNOSIS — M545 Low back pain, unspecified: Secondary | ICD-10-CM | POA: Diagnosis not present

## 2022-02-27 DIAGNOSIS — K219 Gastro-esophageal reflux disease without esophagitis: Secondary | ICD-10-CM | POA: Diagnosis not present

## 2022-02-27 DIAGNOSIS — R109 Unspecified abdominal pain: Secondary | ICD-10-CM

## 2022-02-27 LAB — URINALYSIS, MICROSCOPIC (REFLEX)

## 2022-02-27 LAB — URINALYSIS, ROUTINE W REFLEX MICROSCOPIC
Bilirubin Urine: NEGATIVE
Glucose, UA: NEGATIVE mg/dL
Hgb urine dipstick: NEGATIVE
Ketones, ur: NEGATIVE mg/dL
Nitrite: NEGATIVE
Protein, ur: 30 mg/dL — AB
Specific Gravity, Urine: 1.01 (ref 1.005–1.030)
pH: 5.5 (ref 5.0–8.0)

## 2022-02-27 LAB — PREGNANCY, URINE: Preg Test, Ur: NEGATIVE

## 2022-02-27 MED ORDER — PANTOPRAZOLE SODIUM 20 MG PO TBEC: 20.0000 mg | DELAYED_RELEASE_TABLET | Freq: Every day | ORAL | 0 refills | Status: DC

## 2022-02-27 MED ORDER — ONDANSETRON 4 MG PO TBDP
4.0000 mg | ORAL_TABLET | Freq: Three times a day (TID) | ORAL | 0 refills | Status: AC | PRN
Start: 2022-02-27 — End: 2022-03-04

## 2022-02-27 NOTE — Telephone Encounter (Signed)
  Chief Complaint: Back pain, dark stools, nausea vomiting Symptoms: ibid Frequency: Since Monday Pertinent Negatives: Patient denies Fever Disposition: [] ED /[x] Urgent Care (no appt availability in office) / [] Appointment(In office/virtual)/ []  [facility]/ [] Home Care/ [] Refused Recommended Disposition /[]  Mobile Bus/ []  Follow-up with PCP Additional Notes: Pt was to be seen today as new pt. New pt appointment was cancelled by office. Pt was seen at [facility] on 02/21/2022 for HA and was given naproxen. Pt started with nausea, and vomiting on Monday. Pt was given Pepto. Pt now has dark stools. Pt states that right sided back pain is 8/10. Spoke with , she will follow up with pt for appointment today. Reason for Disposition  [1] MODERATE back pain (e.g., interferes with normal activities) AND [2] present > 3 days  Answer Assessment - Initial Assessment Questions 1. ONSET: "When did the pain begin?"      May 29th 2. LOCATION: "Where does it hurt?" (upper, mid or lower back)    Right side back 3. SEVERITY: "How bad is the pain?"  (e.g., Scale 1-10; mild, moderate, or severe)   - MILD (1-3): doesn't interfere with normal activities    - MODERATE (4-7): interferes with normal activities or awakens from sleep    - SEVERE (8-10): excruciating pain, unable to do any normal activities      9/10 4. PATTERN: "Is the pain constant?" (e.g., yes, no; constant, intermittent)      constant 5. RADIATION: "Does the pain shoot into your legs or elsewhere?"     no 6. CAUSE:  "What do you think is causing the back pain?"      unsure 7. BACK OVERUSE:  "Any recent lifting of heavy objects, strenuous work or exercise?"     no 8. MEDICATIONS: "What have you taken so far for the pain?" (e.g., nothing, acetaminophen, NSAIDS)     Naproxen 9. NEUROLOGIC SYMPTOMS: "Do you have any weakness, numbness, or problems with bowel/bladder control?"     no 10. OTHER SYMPTOMS: "Do you have any other  symptoms?" (e.g., fever, abdominal pain, burning with urination, blood in urine)       Black stool 11. PREGNANCY: "Is there any chance you are pregnant?" (e.g., yes, no; LMP)       na  Protocols used: Back Pain-A-AH

## 2022-02-27 NOTE — Telephone Encounter (Signed)
Patient called to verify that prescription was sent to her pharmacy. I reviewed chart, medications that were prescribed, and pharmacy on file. Voiced understanding.

## 2022-02-27 NOTE — Discharge Instructions (Addendum)
-  X-ray and labs look good today. -Keep your follow-up appointment with your new PCP tomorrow. - Go to ER if your pain worsens then or if you develop a fever.  PAIN: You may take Tylenol for pain relief. Use medications as directed including antiemetics and antidiarrheal medications if suggested or prescribed. You should increase fluids and electrolytes as well as rest over these next several days. If you have any questions or concerns, or if your symptoms are not improving or if especially if they acutely worsen, please call or stop back to the clinic immediately and we will be happy to help you or go to the ER   RED FLAGS: Seek immediate further care if: symptoms remain the same or worsen over the next 3-7 days, you are unable to keep fluids down, you see blood or mucus in your stool, you vomit black or dark red material, you have a fever of 101.F or higher, you have localized and/or persistent abdominal pain

## 2022-02-27 NOTE — ED Triage Notes (Addendum)
Patient c/o nausea, vomiting x 3 days.  Patient noticed stools to be black on yesterday and having to strain to have a BM.  Also having low back pain.  No urinary sx's.

## 2022-02-27 NOTE — Telephone Encounter (Signed)
Patient states she is currently at the urgent care but she will be at her appointment tomorrow @ 1 pm

## 2022-02-27 NOTE — ED Provider Notes (Signed)
[facility]    CSN: [id number] Arrival date & time: 02/27/22  1146      History   Chief Complaint Chief Complaint  Patient presents with   Emesis    HPI Kristie Morrow is a 21 y.o. female presenting for 2 to 3-day history of nausea/vomiting.  Patient reports about 6 episodes of vomiting.  She also reports difficulty urinating but denies any frequency, urgency or hematuria.  Reports right lower back pain/flank pain.  No abdominal pain.  Patient reports she took Pepto-Bismol the other day and after noticed that her stools became dark.  She reports she thinks she is a bit constipated as it is difficult to have a BM.  No associated fevers.  Has had minor congestion and states that a couple family members have had dry coughs but she has not had any other acute URI symptoms.  No sore throat, cough, breathing difficulty.  Personal history of esophagitis/GERD/esophageal dysphagia.  Patient used to take Prilosec but has not been on it for about a year.  No other complaints.  HPI  Past Medical History:  Diagnosis Date   Allergy    SEASONAL    Hypertension     Patient Active Problem List   Diagnosis Date Noted   Gastroesophageal reflux disease    Esophageal dysphagia    Acute esophagitis    Osgood-Schlatter's disease of right lower extremity 11/02/2018    Past Surgical History:  Procedure Laterality Date   ESOPHAGOGASTRODUODENOSCOPY (EGD) WITH PROPOFOL N/A 11/08/2020   Procedure: ESOPHAGOGASTRODUODENOSCOPY (EGD) WITH PROPOFOL;  Surgeon: Pasty Spillersahiliani, Varnita B, MD;  Location: [facility];  Service: Endoscopy;  Laterality: N/A;   EXCISION OF ACCESSORY OSSICLE Right 04/21/2019   Procedure: EXCISION OF A SYMPTOMATIC OSSICLE OF TIBIAL TUBERCLE WITH POSSIBLE REPAIR OF PATELLA TENDONOSSICLE RIGHT;  Surgeon: Christena FlakePoggi, John J, MD;  Location: [facility] ORS;  Service: Orthopedics;  Laterality: Right;   NO PAST SURGERIES      OB History   No obstetric history on file.      Home  Medications    Prior to Admission medications   Medication Sig Start Date End Date Taking? Authorizing Provider  naproxen (NAPROSYN) 500 MG tablet Take 1 tablet (500 mg total) by mouth 2 (two) times daily. 02/21/22  Yes Coralyn MarkMitchell, Melanie L, NP  ondansetron (ZOFRAN-ODT) 4 MG disintegrating tablet Take 1 tablet (4 mg total) by mouth every 8 (eight) hours as needed for up to 5 days for nausea or vomiting. 02/27/22 03/04/22 Yes Shirlee LatchEaves, Jeven Topper B, PA-C  pantoprazole (PROTONIX) 20 MG tablet Take 1 tablet (20 mg total) by mouth daily. 02/27/22 03/29/22 Yes Shirlee LatchEaves, Lalo Tromp B, PA-C  SUMAtriptan (IMITREX) 100 MG tablet Take 1 tablet (100 mg total) by mouth every 2 (two) hours as needed for migraine. May repeat in 2 hours if headache persists or recurs. 08/29/21   Junie SpencerHawks, Christy A, FNP  albuterol (VENTOLIN HFA) 108 (90 Base) MCG/ACT inhaler Inhale 1-2 puffs into the lungs every 6 (six) hours as needed for wheezing or shortness of breath.  03/05/21  [provider]  montelukast (SINGULAIR) 5 MG chewable tablet Chew 5 mg by mouth at bedtime.  03/05/21  [provider]    Family History History reviewed. No pertinent family history.  Social History Social History   Tobacco Use   Smoking status: Never   Smokeless tobacco: Never  Vaping Use   Vaping Use: Never used  Substance Use Topics   Alcohol use: No   Drug use: Never  Allergies   Patient has no known allergies.   Review of Systems Review of Systems  Constitutional:  Negative for fatigue and fever.  HENT:  Positive for congestion.   Respiratory:  Negative for cough and shortness of breath.   Cardiovascular:  Negative for chest pain.  Gastrointestinal:  Positive for constipation, nausea and vomiting. Negative for abdominal pain, diarrhea and rectal pain.  Genitourinary:  Positive for difficulty urinating and flank pain. Negative for decreased urine volume, dysuria, frequency and urgency.  Musculoskeletal:  Positive for back pain.   Neurological:  Negative for weakness.    Physical Exam Triage Vital Signs ED Triage Vitals  Enc Vitals Group     BP      Pulse      Resp      Temp      Temp src      SpO2      Weight      Height      Head Circumference      Peak Flow      Pain Score      Pain Loc      Pain Edu?      Excl. in GC?    No data found.  Updated Vital Signs BP 122/82 (BP Location: Left Arm)   Pulse 78   Temp 99.2 F (37.3 C) (Oral)   Resp 18   Ht  (1.575 m)   Wt 160 lb (72.6 kg)   LMP 02/07/2022 (Approximate) Comment: neg preg test done 5/31/223  SpO2 99%   BMI 29.26 kg/m      Physical Exam Vitals and nursing note reviewed.  Constitutional:      General: She is not in acute distress.    Appearance: Normal appearance. She is not ill-appearing or toxic-appearing.  HENT:     Head: Normocephalic and atraumatic.     Nose: Nose normal.     Mouth/Throat:     Mouth: Mucous membranes are moist.     Pharynx: Oropharynx is clear.  Eyes:     General: No scleral icterus.       Right eye: No discharge.        Left eye: No discharge.     Conjunctiva/sclera: Conjunctivae normal.  Cardiovascular:     Rate and Rhythm: Normal rate and regular rhythm.     Heart sounds: Normal heart sounds.  Pulmonary:     Effort: Pulmonary effort is normal. No respiratory distress.     Breath sounds: Normal breath sounds.  Abdominal:     Palpations: Abdomen is soft.     Tenderness: There is no abdominal tenderness. There is right CVA tenderness. There is no left CVA tenderness.  Musculoskeletal:     Cervical back: Neck supple.  Skin:    General: Skin is dry.  Neurological:     General: No focal deficit present.     Mental Status: She is alert. Mental status is at baseline.     Motor: No weakness.     Gait: Gait normal.  Psychiatric:        Mood and Affect: Mood normal.        Behavior: Behavior normal.        Thought Content: Thought content normal.     UC Treatments / Results  Labs (all  labs ordered are listed, but only abnormal results are displayed) Labs Reviewed  URINALYSIS, ROUTINE W REFLEX MICROSCOPIC - Abnormal; Notable for the following components:      Result Value  Protein, ur 30 (*)    Leukocytes,Ua TRACE (*)    All other components within normal limits  URINALYSIS, MICROSCOPIC (REFLEX) - Abnormal; Notable for the following components:   Bacteria, UA RARE (*)    All other components within normal limits  URINE CULTURE  PREGNANCY, URINE    EKG   Radiology DG Abdomen 1 View  Result Date: 02/27/2022 CLINICAL DATA:  Right flank pain EXAM: ABDOMEN - 1 VIEW COMPARISON:  None Available. FINDINGS: The bowel gas pattern is normal. No radio-opaque calculi or other significant radiographic abnormality are seen. IMPRESSION: Negative. Electronically Signed   By: Allegra Lai M.D.   On: 02/27/2022 13:36    Procedures Procedures (including critical care time)  Medications Ordered in UC Medications - No data to display  Initial Impression / Assessment and Plan / UC Course  I have reviewed the triage vital signs and the nursing notes.  Pertinent labs & imaging results that were available during my care of the patient were reviewed by me and considered in my medical decision making (see chart for details).  21 year old female presenting for 2 to 3-day history of nausea/vomiting, right flank pain, urinary frequency and difficulty urinating.  Patient also reporting constipation and when she does go she has dark stools but has taken Pepto-Bismol.  No associated fevers.  Mild congestion and has been around relatives with upper respiratory infections.  Vitals are normal and stable.  Patient overall well-appearing.  Normal HEENT exam.  Abdomen is soft and nontender.  Right CVA tenderness noted.  Chest clear auscultation and heart regular rate and rhythm.  Urine pregnancy test negative. Urinalysis shows trace leukocytes.  We will send urine for culture.  KUB obtained  to assess for possible kidney stone or constipation/bowel obstruction.  KUB normal.  Reviewed images myself.  Reviewed all lab results with patient.  Symptoms could be consistent with her GERD.  We will try trial of pantoprazole.  Also sent Zofran ODT and encouraged her to increase rest and fluids.  She does have a follow-up appointment with her PCP tomorrow.  Advised keeping the appointment but if she were to develop a fever or worsening pain before then to go to emergency department as she may need a CT scan.   Final Clinical Impressions(s) / UC Diagnoses   Final diagnoses:  Nausea vomiting and diarrhea  Acute right-sided low back pain without sciatica  Gastroesophageal reflux disease, unspecified whether esophagitis present     Discharge Instructions      -X-ray and labs look good today. -Keep your follow-up appointment with your new PCP tomorrow. - Go to ER if your pain worsens then or if you develop a fever.  PAIN: You may take Tylenol for pain relief. Use medications as directed including antiemetics and antidiarrheal medications if suggested or prescribed. You should increase fluids and electrolytes as well as rest over these next several days. If you have any questions or concerns, or if your symptoms are not improving or if especially if they acutely worsen, please call or stop back to the clinic immediately and we will be happy to help you or go to the ER   RED FLAGS: Seek immediate further care if: symptoms remain the same or worsen over the next 3-7 days, you are unable to keep fluids down, you see blood or mucus in your stool, you vomit black or dark red material, you have a fever of 101.F or higher, you have localized and/or persistent abdominal pain  ED Prescriptions     Medication Sig Dispense Auth. Provider   pantoprazole (PROTONIX) 20 MG tablet Take 1 tablet (20 mg total) by mouth daily. 30 tablet Eusebio Friendly B, PA-C   ondansetron (ZOFRAN-ODT) 4 MG  disintegrating tablet Take 1 tablet (4 mg total) by mouth every 8 (eight) hours as needed for up to 5 days for nausea or vomiting. 15 tablet Gareth Morgan      PDMP not reviewed this encounter.   Shirlee Latch, PA-C 02/27/22 1400

## 2022-02-28 ENCOUNTER — Encounter: Payer: Self-pay | Admitting: Nurse Practitioner

## 2022-02-28 ENCOUNTER — Ambulatory Visit: Payer: Self-pay | Admitting: Nurse Practitioner

## 2022-02-28 VITALS — BP 121/77 | HR 96 | Temp 98.5°F | Ht 62.21 in | Wt 157.4 lb

## 2022-02-28 DIAGNOSIS — K921 Melena: Secondary | ICD-10-CM

## 2022-02-28 DIAGNOSIS — Z7689 Persons encountering health services in other specified circumstances: Secondary | ICD-10-CM

## 2022-02-28 DIAGNOSIS — R1011 Right upper quadrant pain: Secondary | ICD-10-CM

## 2022-02-28 LAB — URINE CULTURE: Culture: <10000 — AB

## 2022-02-28 NOTE — Progress Notes (Signed)
BP 121/77   Pulse 96   Temp 98.5 F (36.9 C) (Oral)   Ht 5' 2.21" (1.58 m)   Wt 157 lb 6.4 oz (71.4 kg)   LMP 02/07/2022 (Approximate) Comment: neg preg test done 5/31/223  SpO2 99%   BMI 28.60 kg/m    Subjective:    Patient ID: Kristie Morrow, female    DOB: 06-27-2001, 21 y.o.   MRN: [id number]  HPI: Kristie Morrow is a 21 y.o. female  Chief Complaint  Patient presents with   Establish Care   Nausea    Along with emesis x 4 days. Pt reports black stools x 2 days, states she took a small amount of pepto bismol on Tuesday but was not able to keep it down.    Back Pain    R low back pain ongoing x 4 days. Denies recent fall/injury, no hx of kidney stones.    Patient presents to clinic to establish care with new PCP.  Introduced to Designer, jewellery role and practice setting.  All questions answered.  Discussed provider/patient relationship and expectations.  Patient reports a history of migraines.  Patient denies a history of: Hypertension, Elevated Cholesterol, Diabetes, Thyroid problems, Depression, Anxiety, Neurological problems, and Abdominal problems.   Patient states that on Monday she started having nausea, vomiting and having black stools.  States it has been difficult to have a BM or pee.  She states she is also having some lower right back pain. Patient rates the back pain at a 6/10. Denies any fever, sob, cold intolerance.  States she has been having some fatigue and upper mid abdominal pain and right upper abdominal pain.  She is drinking "a lot" of water and Gatorade.  States she hasn't had much of an appetite.  Patient states she hasn't had any episodes of vomiting today.  Yesterday she vomited x 1.  The black stool started two days ago.  She was given protonix at [facility] yesterday which has helped her symptoms.  She feels like it isn't getting better but staying the same.  Active Ambulatory Problems    Diagnosis Date Noted   Osgood-Schlatter's disease of right lower  extremity 11/02/2018   Gastroesophageal reflux disease    Esophageal dysphagia    Acute esophagitis    RUQ abdominal pain 02/28/2022   Resolved Ambulatory Problems    Diagnosis Date Noted   No Resolved Ambulatory Problems   Past Medical History:  Diagnosis Date   Allergy    Hypertension    Past Surgical History:  Procedure Laterality Date   ESOPHAGOGASTRODUODENOSCOPY (EGD) WITH PROPOFOL N/A 11/08/2020   Procedure: ESOPHAGOGASTRODUODENOSCOPY (EGD) WITH PROPOFOL;  Surgeon: Virgel Manifold, MD;  Location: [facility] END[facility];  Service: Endoscopy;  Laterality: N/A;   EXCISION OF ACCESSORY OSSICLE Right 04/21/2019   Procedure: EXCISION OF A SYMPTOMATIC OSSICLE OF TIBIAL TUBERCLE WITH POSSIBLE REPAIR OF PATELLA TENDONOSSICLE RIGHT;  Surgeon: Corky Mull, MD;  Location: [facility] ORS;  Service: Orthopedics;  Laterality: Right;   NO PAST SURGERIES     History reviewed. No pertinent family history.   Review of Systems  Constitutional:  Positive for fatigue. Negative for fever.  Respiratory:  Negative for shortness of breath.   Gastrointestinal:  Positive for abdominal pain, nausea and vomiting.       Black stool  Endocrine: Negative for cold intolerance.   Per HPI unless specifically indicated above     Objective:    BP 121/77   Pulse 96   Temp  98.5 F (36.9 C) (Oral)   Ht 5' 2.21" (1.58 m)   Wt 157 lb 6.4 oz (71.4 kg)   LMP 02/07/2022 (Approximate) Comment: neg preg test done 5/31/223  SpO2 99%   BMI 28.60 kg/m   Wt Readings from Last 3 Encounters:  02/28/22 157 lb 6.4 oz (71.4 kg)  02/27/22 160 lb (72.6 kg)  08/27/21 158 lb (71.7 kg) (86 %, Z= 1.08)*   * Growth percentiles are based on CDC (Girls, 2-20 Years) data.    Physical Exam Vitals and nursing note reviewed.  Constitutional:      General: She is not in acute distress.    Appearance: Normal appearance. She is normal weight. She is not ill-appearing, toxic-appearing or diaphoretic.  HENT:     Head: Normocephalic.      Right Ear: External ear normal.     Left Ear: External ear normal.     Nose: Nose normal.     Mouth/Throat:     Mouth: Mucous membranes are moist.     Pharynx: Oropharynx is clear.  Eyes:     General:        Right eye: No discharge.        Left eye: No discharge.     Extraocular Movements: Extraocular movements intact.     Conjunctiva/sclera: Conjunctivae normal.     Pupils: Pupils are equal, round, and reactive to light.  Cardiovascular:     Rate and Rhythm: Normal rate and regular rhythm.     Heart sounds: No murmur heard. Pulmonary:     Effort: Pulmonary effort is normal. No respiratory distress.     Breath sounds: Normal breath sounds. No wheezing or rales.  Abdominal:     General: Abdomen is flat. Bowel sounds are normal. There is no distension.     Palpations: Abdomen is soft.     Tenderness: There is abdominal tenderness in the right upper quadrant and epigastric area. There is no right CVA tenderness, left CVA tenderness or guarding. Negative signs include Murphy's sign and McBurney's sign.  Musculoskeletal:     Cervical back: Normal range of motion and neck supple.  Skin:    General: Skin is warm and dry.     Capillary Refill: Capillary refill takes less than 2 seconds.  Neurological:     General: No focal deficit present.     Mental Status: She is alert and oriented to person, place, and time. Mental status is at baseline.  Psychiatric:        Mood and Affect: Mood normal.        Behavior: Behavior normal.        Thought Content: Thought content normal.        Judgment: Judgment normal.    Results for orders placed or performed during the hospital encounter of 02/27/22  Urinalysis, Routine w reflex microscopic  Result Value Ref Range   Color, Urine YELLOW YELLOW   APPearance CLEAR CLEAR   Specific Gravity, Urine 1.010 1.005 - 1.030   pH 5.5 5.0 - 8.0   Glucose, UA NEGATIVE NEGATIVE mg/dL   Hgb urine dipstick NEGATIVE NEGATIVE   Bilirubin Urine NEGATIVE  NEGATIVE   Ketones, ur NEGATIVE NEGATIVE mg/dL   Protein, ur 30 (A) NEGATIVE mg/dL   Nitrite NEGATIVE NEGATIVE   Leukocytes,Ua TRACE (A) NEGATIVE  Pregnancy, urine  Result Value Ref Range   Preg Test, Ur NEGATIVE NEGATIVE  Urinalysis, Microscopic (reflex)  Result Value Ref Range   RBC / HPF 0-5 0 -  5 RBC/hpf   WBC, UA 0-5 0 - 5 WBC/hpf   Bacteria, UA RARE (A) NONE SEEN   Squamous Epithelial / LPF 0-5 0 - 5      Assessment & Plan:   Problem List Items Addressed This Visit       Other   RUQ abdominal pain - Primary    Suspect it is viral. Will check CBC, CMP, and stool studies. [place] of RUQ ordered during visit. Will make recommendations based on results. 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  She leaves in 2 days.  Discussed signs and symptoms to monitor for and if symptoms escalate, recommend being seen in the ER. Follow up in 1 month.       Relevant Orders   Comp Met (CMET)   CBC w/Diff   US Abdomen Limited RUQ (LIVER/GB)   Other Visit Diagnoses     Black stools       Relevant Orders   Fecal occult blood, imunochemical   Stool Culture   Encounter to establish care            Follow up plan: Return in about 1 month (around 03/30/2022) for Abdominal Pain.

## 2022-02-28 NOTE — Assessment & Plan Note (Signed)
Suspect it is viral. Will check CBC, CMP, and stool studies. [place] of RUQ ordered during visit. Will make recommendations based on results. 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  She leaves in 2 days.  Discussed signs and symptoms to monitor for and if symptoms escalate, recommend being seen in the ER. Follow up in 1 month.

## 2022-03-01 ENCOUNTER — Emergency Department: Payer: BC Managed Care – PPO

## 2022-03-01 ENCOUNTER — Encounter: Payer: Self-pay | Admitting: Emergency Medicine

## 2022-03-01 ENCOUNTER — Other Ambulatory Visit: Payer: Self-pay

## 2022-03-01 ENCOUNTER — Emergency Department
Admission: EM | Admit: 2022-03-01 | Discharge: 2022-03-01 | Disposition: A | Discharge status: Home / Self Care | Payer: BC Managed Care – PPO | Attending: Emergency Medicine | Admitting: Emergency Medicine

## 2022-03-01 ENCOUNTER — Telehealth: Payer: Self-pay

## 2022-03-01 DIAGNOSIS — R630 Anorexia: Secondary | ICD-10-CM | POA: Diagnosis not present

## 2022-03-01 DIAGNOSIS — N390 Urinary tract infection, site not specified: Secondary | ICD-10-CM | POA: Insufficient documentation

## 2022-03-01 DIAGNOSIS — R944 Abnormal results of kidney function studies: Secondary | ICD-10-CM | POA: Diagnosis not present

## 2022-03-01 DIAGNOSIS — R197 Diarrhea, unspecified: Secondary | ICD-10-CM | POA: Diagnosis not present

## 2022-03-01 DIAGNOSIS — R1031 Right lower quadrant pain: Secondary | ICD-10-CM | POA: Diagnosis present

## 2022-03-01 LAB — COMPREHENSIVE METABOLIC PANEL
ALT: 10 IU/L (ref 0–32)
AST: 15 IU/L (ref 0–40)
Albumin/Globulin Ratio: 1.5 (ref 1.2–2.2)
Albumin: 4.8 g/dL (ref 3.9–5.0)
Alkaline Phosphatase: 81 IU/L (ref 42–106)
BUN/Creatinine Ratio: 9 (ref 9–23)
BUN: 16 mg/dL (ref 6–20)
Bilirubin Total: 0.4 mg/dL (ref 0.0–1.2)
CO2: 21 mmol/L (ref 20–29)
Calcium: 9.7 mg/dL (ref 8.7–10.2)
Chloride: 101 mmol/L (ref 96–106)
Creatinine, Ser: 1.86 mg/dL — ABNORMAL HIGH (ref 0.57–1.00)
Globulin, Total: 3.2 g/dL (ref 1.5–4.5)
Glucose: 74 mg/dL (ref 70–99)
Potassium: 4.2 mmol/L (ref 3.5–5.2)
Sodium: 140 mmol/L (ref 134–144)
Total Protein: 8 g/dL (ref 6.0–8.5)
eGFR: 39 mL/min/{1.73_m2} — ABNORMAL LOW (ref >59)

## 2022-03-01 LAB — CBC WITH DIFFERENTIAL/PLATELET
Basophils Absolute: 0.1 10*3/uL (ref 0.0–0.2)
Basos: 0 %
EOS (ABSOLUTE): 0.1 10*3/uL (ref 0.0–0.4)
Eos: 1 %
Hematocrit: 42.4 % (ref 34.0–46.6)
Hemoglobin: 13.7 g/dL (ref 11.1–15.9)
Immature Grans (Abs): 0.1 10*3/uL (ref 0.0–0.1)
Immature Granulocytes: 1 %
Lymphocytes Absolute: 1.5 10*3/uL (ref 0.7–3.1)
Lymphs: 13 %
MCH: 26.1 pg — ABNORMAL LOW (ref 26.6–33.0)
MCHC: 32.3 g/dL (ref 31.5–35.7)
MCV: 81 fL (ref 79–97)
Monocytes Absolute: 0.7 10*3/uL (ref 0.1–0.9)
Monocytes: 6 %
Neutrophils Absolute: 9.8 10*3/uL — ABNORMAL HIGH (ref 1.4–7.0)
Neutrophils: 79 %
Platelets: 478 10*3/uL — ABNORMAL HIGH (ref 150–450)
RBC: 5.25 x10E6/uL (ref 3.77–5.28)
RDW: 12.4 % (ref 11.7–15.4)
WBC: 12.2 10*3/uL — ABNORMAL HIGH (ref 3.4–10.8)

## 2022-03-01 LAB — URINALYSIS, ROUTINE W REFLEX MICROSCOPIC
Bilirubin Urine: NEGATIVE
Glucose, UA: NEGATIVE mg/dL
Hgb urine dipstick: NEGATIVE
Ketones, ur: NEGATIVE mg/dL
Nitrite: NEGATIVE
Protein, ur: NEGATIVE mg/dL
Specific Gravity, Urine: 1.01 (ref 1.005–1.030)
pH: 5 (ref 5.0–8.0)

## 2022-03-01 LAB — CBC
HCT: 40 % (ref 36.0–46.0)
Hemoglobin: 12.4 g/dL (ref 12.0–15.0)
MCH: 25.3 pg — ABNORMAL LOW (ref 26.0–34.0)
MCHC: 31 g/dL (ref 30.0–36.0)
MCV: 81.5 fL (ref 80.0–100.0)
Platelets: 442 10*3/uL — ABNORMAL HIGH (ref 150–400)
RBC: 4.91 MIL/uL (ref 3.87–5.11)
RDW: 12.8 % (ref 11.5–15.5)
WBC: 10.4 10*3/uL (ref 4.0–10.5)
nRBC: 0 % (ref 0.0–0.2)

## 2022-03-01 LAB — LIPASE, BLOOD: Lipase: 30 U/L (ref 11–51)

## 2022-03-01 LAB — BASIC METABOLIC PANEL
Anion gap: 8 (ref 5–15)
BUN: 20 mg/dL (ref 6–20)
CO2: 23 mmol/L (ref 22–32)
Calcium: 9.3 mg/dL (ref 8.9–10.3)
Chloride: 108 mmol/L (ref 98–111)
Creatinine, Ser: 1.68 mg/dL — ABNORMAL HIGH (ref 0.44–1.00)
GFR, Estimated: 44 mL/min — ABNORMAL LOW (ref >60)
Glucose, Bld: 100 mg/dL — ABNORMAL HIGH (ref 70–99)
Potassium: 4.1 mmol/L (ref 3.5–5.1)
Sodium: 139 mmol/L (ref 135–145)

## 2022-03-01 MED ORDER — CEPHALEXIN 500 MG PO CAPS: 500.0000 mg | ORAL_CAPSULE | Freq: Two times a day (BID) | ORAL | 0 refills | Status: DC

## 2022-03-01 MED ORDER — SODIUM CHLORIDE 0.9 % IV SOLN
1000.0000 mL | Freq: Once | INTRAVENOUS | Status: AC
Start: 2022-03-01 — End: 2022-03-01
  Administered 2022-03-01: 1000 mL via INTRAVENOUS

## 2022-03-01 NOTE — ED Triage Notes (Signed)
Pt to ED via POV with mother. Pt had blood work done yesterday at her PCP office and it showed that her Cr was elevated and her GFR was low. Pt has hx/o HTN. Pt states that she has not been taking the medication for about 6 months. Pt does not have any other significant medical history. Pt states that she has been having right flank pain for the last week, had a urine sample done 2 days ago but urine culture did not show significant growth. Pt states that she is still having some burning with urination.

## 2022-03-01 NOTE — Progress Notes (Signed)
Called and left patient a message to call the office back regarding her lab results.    Okay for PEC to tell patient to go to ER.  Her kidney function shows that it is declined and she likely needs fluids.  Her white blood cell count is also elevated which means she has an infection somewhere and needs further evaluation.

## 2022-03-01 NOTE — ED Provider Notes (Signed)
[facility] Provider Note    Event Date/Time   First MD Initiated Contact with Patient 03/01/22 1037     (approximate)   History   Abnormal labs   HPI  Kristie Morrow is a 21 y.o. female who was referred to the emergency department for abnormal labs as well as abdominal discomfort.  Patient describes right lower quadrant abdominal and lower back pain x2 to 3 days.  Prior to that she had nausea vomiting diarrhea.  She apparently had labs drawn in urgent care 2 days ago, was referred to the emergency department because of elevated creatinine and abdominal pain she denies fevers or chills.  No further nausea vomiting, is tolerating p.o.'s but has decreased appetite     Physical Exam   Triage Vital Signs: ED Triage Vitals  Enc Vitals Group     BP 03/01/22 1011 139/90     Pulse Rate 03/01/22 1009 72     Resp 03/01/22 1009 18     Temp 03/01/22 1009 98.1 F (36.7 C)     Temp Source 03/01/22 1009 Oral     SpO2 03/01/22 1009 98 %     Weight 03/01/22 1010 71.2 kg (157 lb)     Height 03/01/22 1010 1.575 m (5\' 2" )     Head Circumference --      Peak Flow --      Pain Score 03/01/22 1010 5     Pain Loc --      Pain Edu? --      Excl. in [place]? --     Most recent vital signs: Vitals:   03/01/22 1011 03/01/22 1158  BP: 139/90 (!) 149/92  Pulse:  60  Resp:  15  Temp:  98.2 F (36.8 C)  SpO2:  96%     General: Awake, no distress.  CV:  Good peripheral perfusion.  Resp:  Normal effort.  Abd:  No distention.  No significant right lower quadrant tenderness, no CVA tenderness Other:     ED Results / Procedures / Treatments   Labs (all labs ordered are listed, but only abnormal results are displayed) Labs Reviewed  BASIC METABOLIC PANEL - Abnormal; Notable for the following components:      Result Value   Glucose, Bld 100 (*)    Creatinine, Ser 1.68 (*)    GFR, Estimated 44 (*)    All other components within normal limits  CBC - Abnormal;  Notable for the following components:   MCH 25.3 (*)    Platelets 442 (*)    All other components within normal limits  URINALYSIS, ROUTINE W REFLEX MICROSCOPIC - Abnormal; Notable for the following components:   Color, Urine YELLOW (*)    APPearance CLEAR (*)    Leukocytes,Ua SMALL (*)    Bacteria, UA RARE (*)    All other components within normal limits  LIPASE, BLOOD     EKG     RADIOLOGY CT renal stone study reviewed by me, no evidence of kidney stone    PROCEDURES:  Critical Care performed:   Procedures   MEDICATIONS ORDERED IN ED: Medications  0.9 %  sodium chloride infusion (0 mLs Intravenous Stopped 03/01/22 1159)     IMPRESSION / MDM / ASSESSMENT AND PLAN / ED COURSE  I reviewed the triage vital signs and the nursing notes. Patient's presentation is most consistent with acute presentation with potential threat to life or bodily function.  Patient presents with abdominal pain as detailed above  reports of elevated creatinine.  We will obtain labs, treat with IV fluids and obtain CT abdomen pelvis  Lab work reviewed and is reassuring, normal white blood cell count, creatinine 1.68 trending down, patient has IV fluids infusing I suspect the patient was just dehydrated  Urinalysis demonstrates elevated white blood cells, the patient does note dysuria as well.  Question whether her pain is related to UTI  CT scan negative for kidney stone or appendicitis, will treat with antibiotics, recommend continue p.o. hydration, appropriate for outpatient follow-up.        FINAL CLINICAL IMPRESSION(S) / ED DIAGNOSES   Final diagnoses:  Lower urinary tract infectious disease     Rx / DC Orders   ED Discharge Orders          Ordered    cephALEXin (KEFLEX) 500 MG capsule  2 times daily        03/01/22 1142             Note:  This document was prepared using Dragon voice recognition software and may include unintentional dictation errors.   Lavonia Drafts, MD 03/01/22 1400

## 2022-03-01 NOTE — ED Notes (Signed)
See triage note.  Right flank pain since Wednesday.  Nause avomiting.  Last vomit was wed, but has not been able to eat. No distress.

## 2022-03-01 NOTE — ED Notes (Signed)
Unable to obtain signature at time of discharge due to signature pad not working.

## 2022-03-01 NOTE — ED Notes (Signed)
Patient says she has right flank pain since Wednesday.  Nausea vomiting--last vomited on Wednesday, but she has not been able to eat.  She says she already obtained urine specimen in triage

## 2022-03-01 NOTE — Telephone Encounter (Signed)
Pt. Given message to go to ED from PCP. Verbalizes understanding.

## 2022-03-01 NOTE — ED Triage Notes (Signed)
Pt ambualtory with reports that she was sent from PCP for abnormal labs, Chart states:  "Her kidney function shows that it is declined and she likely needs fluids.  Her white blood cell count is also elevated which means she has an infection somewhere and needs further evaluation".

## 2022-03-07 ENCOUNTER — Other Ambulatory Visit: Payer: BC Managed Care – PPO

## 2022-03-13 ENCOUNTER — Ambulatory Visit: Payer: Self-pay | Admitting: Nurse Practitioner

## 2022-04-01 ENCOUNTER — Ambulatory Visit: Payer: BC Managed Care – PPO | Admitting: Nurse Practitioner

## 2022-04-03 ENCOUNTER — Ambulatory Visit: Payer: Self-pay | Admitting: Family Medicine

## 2022-04-23 ENCOUNTER — Ambulatory Visit: Payer: BC Managed Care – PPO | Admitting: Nurse Practitioner

## 2022-04-26 ENCOUNTER — Ambulatory Visit: Payer: BC Managed Care – PPO | Admitting: Nurse Practitioner

## 2022-07-06 IMAGING — US US RENAL ARTERY STENOSIS
2 series · 13 of 25 positions shown · non-contrast
Comparison: None.

CLINICAL DATA: Secondary hypertension. Evaluate for renal artery
stenosis.

EXAM:
RENAL/URINARY TRACT ULTRASOUND
RENAL DUPLEX DOPPLER ULTRASOUND

[Series 1: us renal artery stenosis · 0.22mm/px · 12 of 90 slices shown (1 of 2)]
[im 1/90]
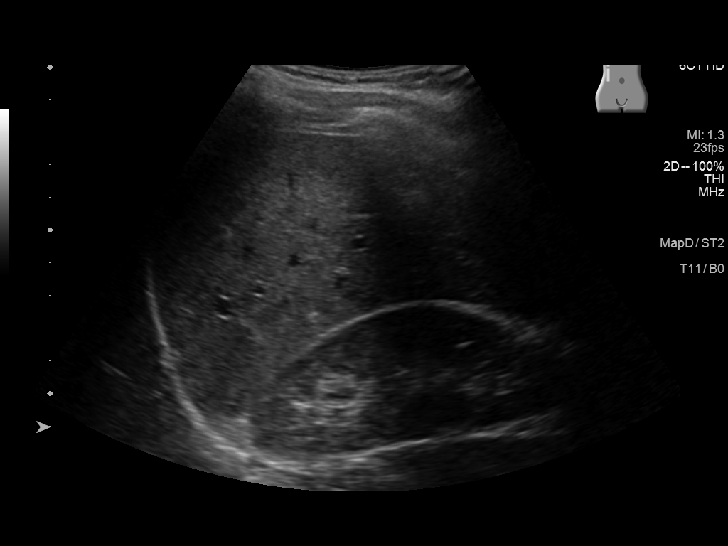
[im 8/90]
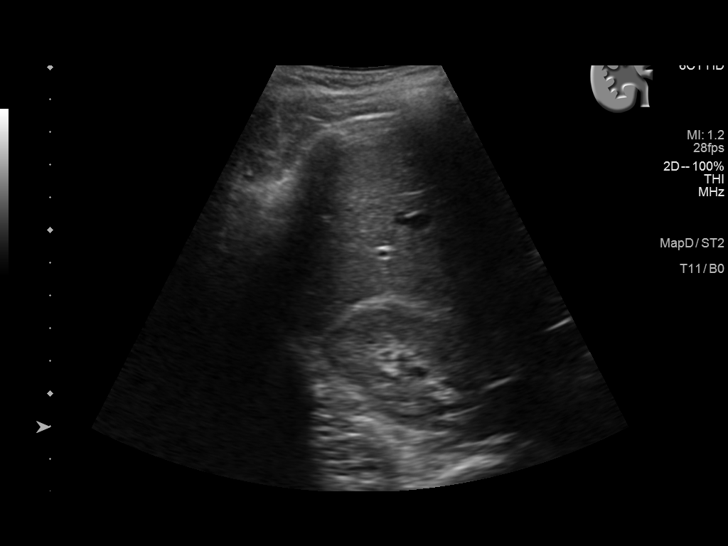
[im 16/90]
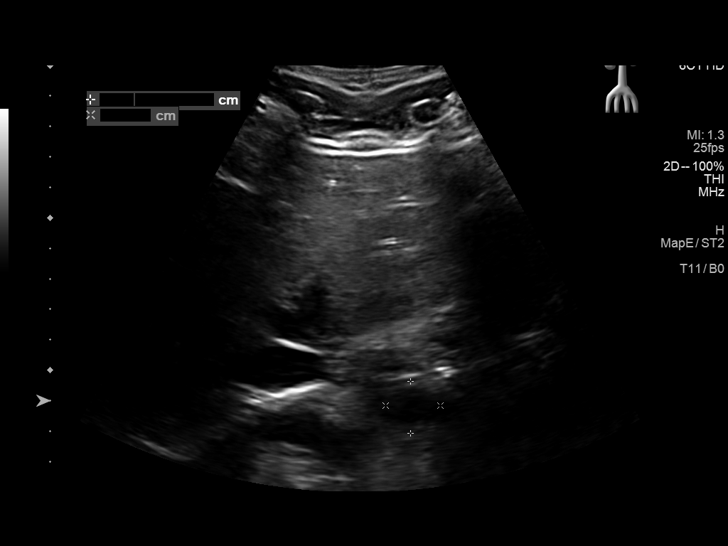
[im 24/90]
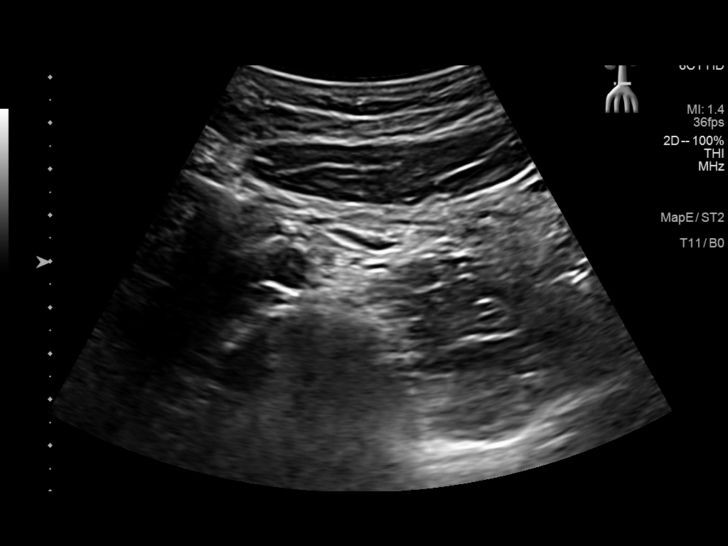
[im 31/90]
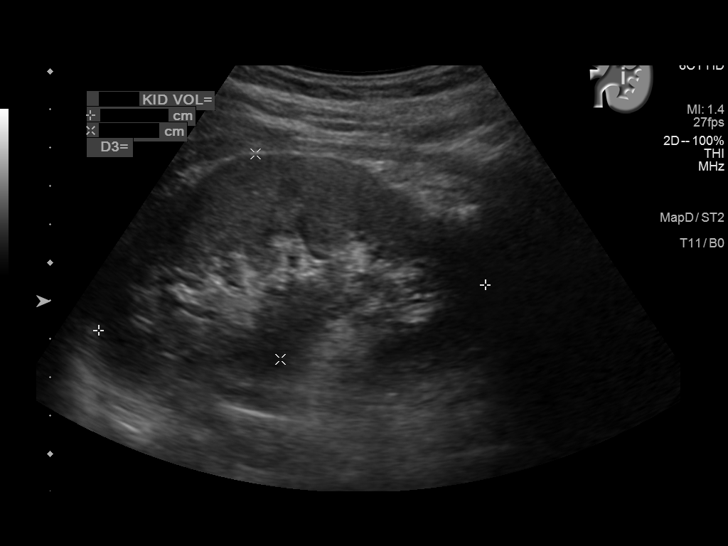
[im 39/90]
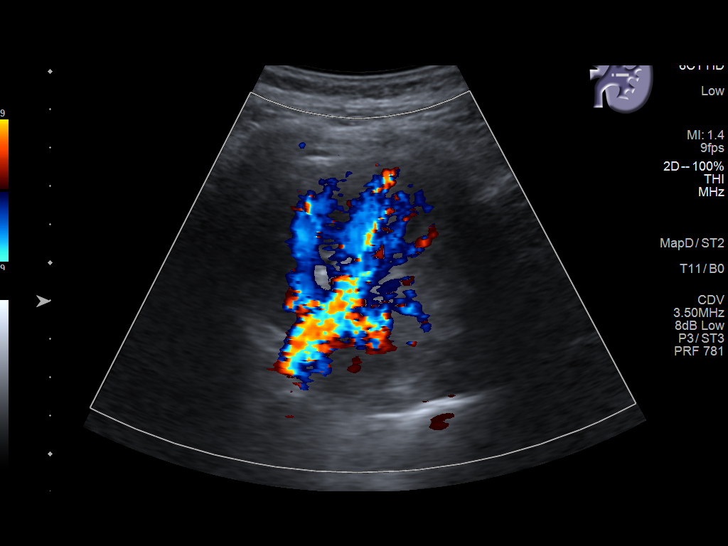
[im 47/90]
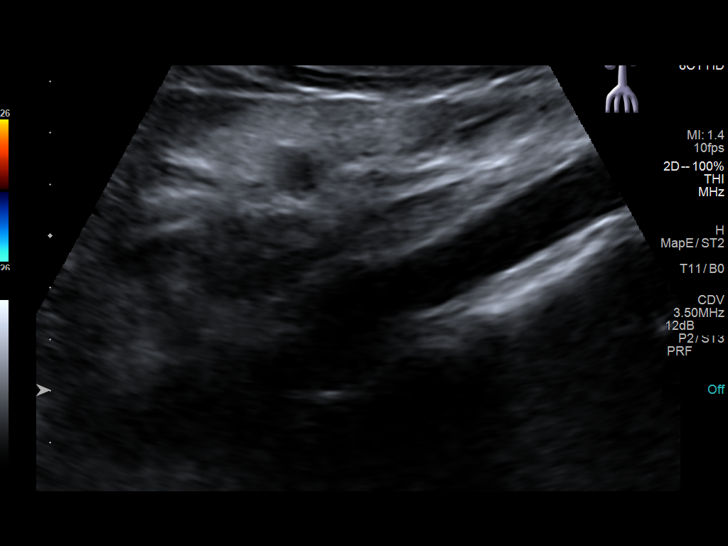
[im 55/90]
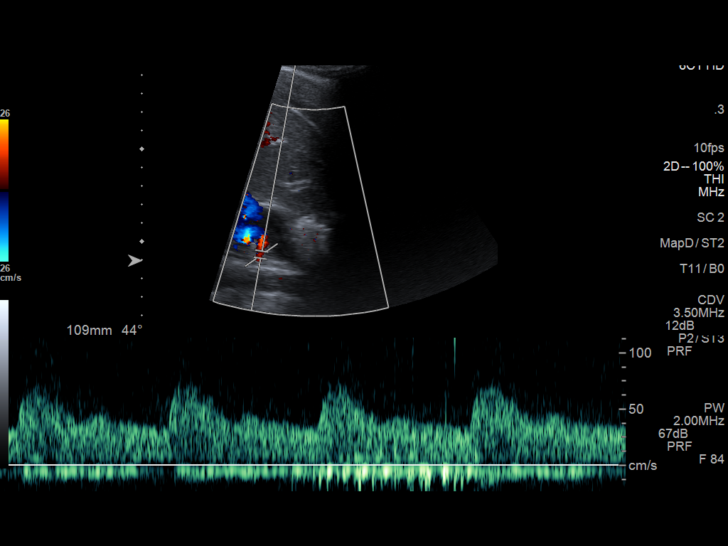
[im 62/90]
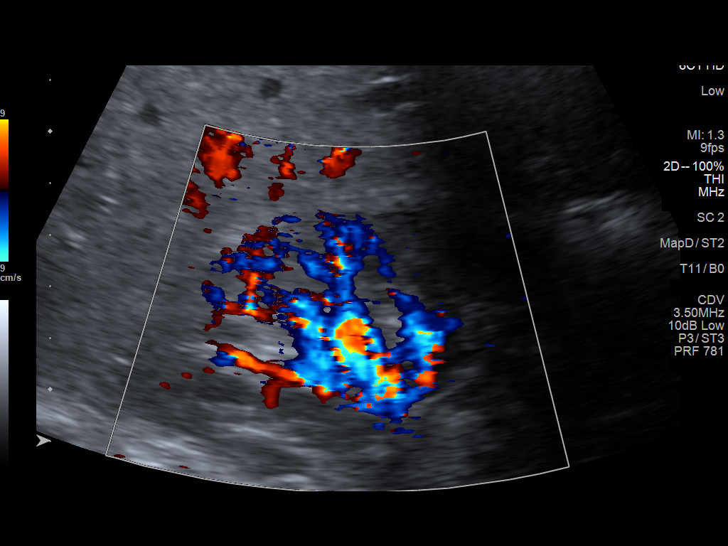
[im 70/90]
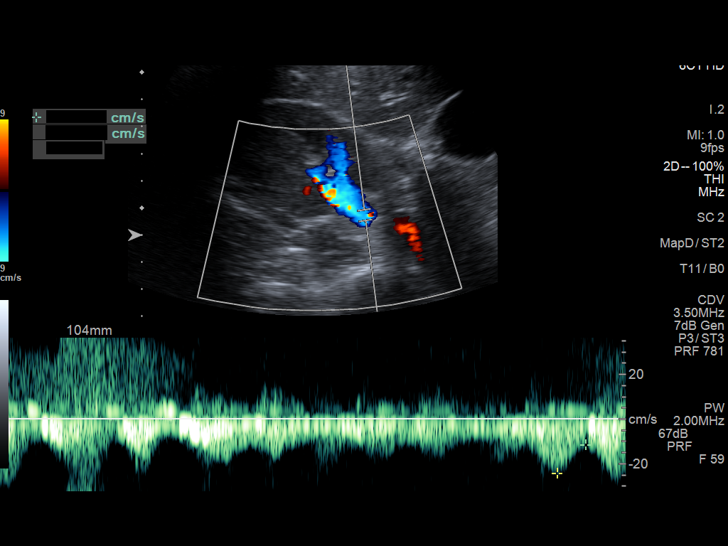
[im 78/90]
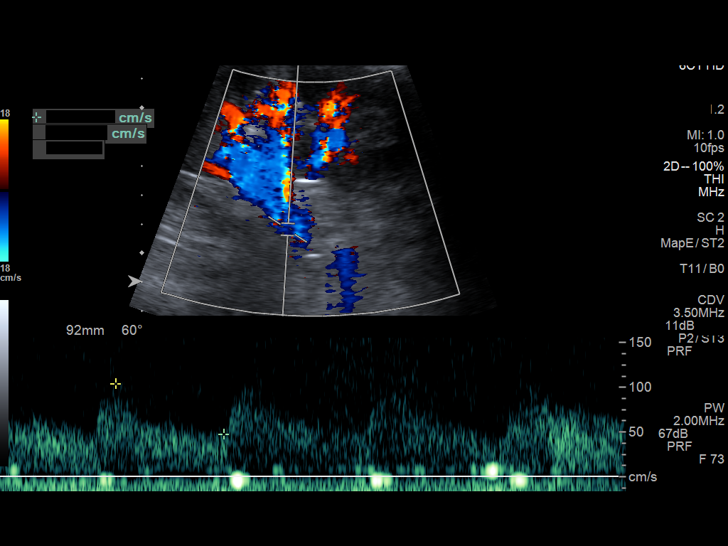
[im 86/90]
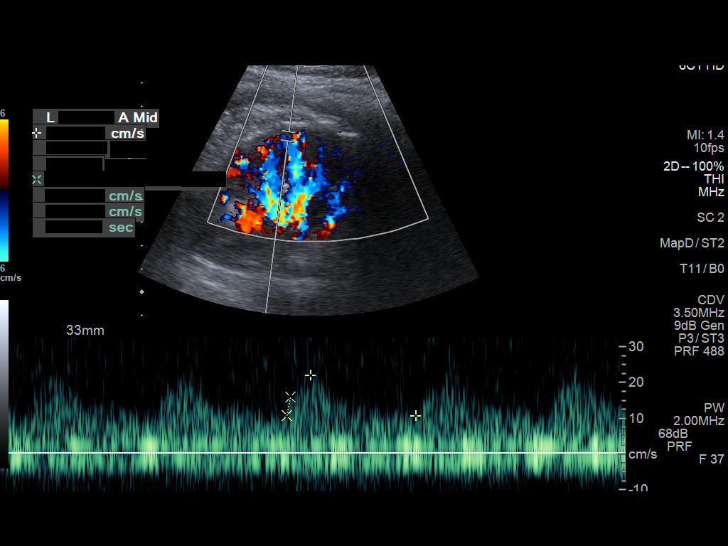

[Series 2001: us renal artery stenosis · 1 of 4 slices shown (2 of 2)]
[im 1/4]
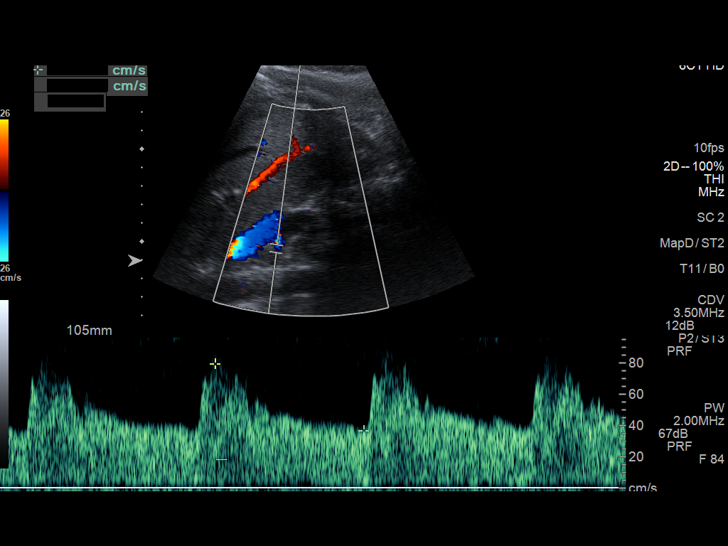

[13 of 25 positions shown; findings below may reference images not displayed]

FINDINGS: Right Kidney:

Normal cortical thickness, echogenicity and size, measuring 10.5 cm
in length. No focal renal lesions. No echogenic renal stones. No
urinary obstruction.

Left Kidney:

Normal cortical thickness, echogenicity and size, measuring 10.2 cm
in length. No focal renal lesions. No echogenic renal stones. No
urinary obstruction.

Bladder: Appears normal given degree of distention. Bilateral
ureteral jets are identified.

_________________________________________________________

RENAL DUPLEX ULTRASOUND

Right Renal Artery Velocities:

Origin:  126 cm/sec

Mid:  99 cm/sec

Hilum:  97 cm/sec

Interlobar:  57 cm/sec

Arcuate:  22 cm/sec

Left Renal Artery Velocities:

Origin:  115 cm/sec

Mid:  104 cm/sec

Hilum:  185 cm/sec

Interlobar:  45 cm/sec

Arcuate:  22 cm/sec

Aortic Velocity:  169 cm/sec

Right Renal-Aortic Ratios:

Origin:

Mid:

Hilum:

Interlobar:

Arcuate:

Left Renal-Aortic Ratios:

Origin:

Mid:

Hilum:

Interlobar:

Arcuate:

Normal velocities and low resistance waveforms are demonstrated
throughout the interrogated portions of the bilateral renal arteries
and renal parenchyma. The bilateral renal veins appear patent.

Normal caliber of the abdominal aorta.
IMPRESSION: 1. No evidence of renal artery stenosis.
2. Normal renal ultrasound. Specifically, no evidence of asymmetric
renal atrophy or urinary obstruction.

## 2023-02-25 ENCOUNTER — Ambulatory Visit
Admission: RE | Admit: 2023-02-25 | Discharge: 2023-02-25 | Disposition: A | Discharge status: Home / Self Care | Payer: No Typology Code available for payment source | Source: Ambulatory Visit | Attending: Nurse Practitioner | Admitting: Nurse Practitioner

## 2023-02-25 ENCOUNTER — Other Ambulatory Visit: Payer: Self-pay | Admitting: Nurse Practitioner

## 2023-02-25 DIAGNOSIS — Z021 Encounter for pre-employment examination: Secondary | ICD-10-CM

## 2023-03-18 ENCOUNTER — Telehealth: Payer: Self-pay

## 2023-03-18 NOTE — Telephone Encounter (Signed)
LVM for patient to call back [phone], or to call PCP office to schedule follow up apt. AS, CMA  

## 2023-07-02 ENCOUNTER — Ambulatory Visit (HOSPITAL_COMMUNITY): Payer: BC Managed Care – PPO

## 2023-07-31 ENCOUNTER — Encounter: Payer: Self-pay | Admitting: Nurse Practitioner

## 2024-04-15 ENCOUNTER — Ambulatory Visit: Payer: Self-pay | Admitting: Nurse Practitioner

## 2024-04-15 ENCOUNTER — Encounter: Payer: Self-pay | Admitting: Nurse Practitioner

## 2024-04-15 ENCOUNTER — Ambulatory Visit: Admitting: Nurse Practitioner

## 2024-04-15 VITALS — BP 108/73 | HR 72 | Temp 98.3°F | Wt 166.6 lb

## 2024-04-15 DIAGNOSIS — J029 Acute pharyngitis, unspecified: Secondary | ICD-10-CM

## 2024-04-15 DIAGNOSIS — J069 Acute upper respiratory infection, unspecified: Secondary | ICD-10-CM

## 2024-04-15 NOTE — Progress Notes (Signed)
 BP 108/73   Pulse 72   Temp 98.3 F (36.8 C) (Oral)   Wt 166 lb 9.6 oz (75.6 kg)   LMP 03/15/2024 (Approximate)   SpO2 98%   BMI 30.47 kg/m    Subjective:    Patient ID: Kristie Morrow, female    DOB: 2001-09-29, 23 y.o.   MRN: [id number]  HPI: Kristie Morrow is a 23 y.o. female  Chief Complaint  Patient presents with   Sore Throat    Started last night    UPPER RESPIRATORY TRACT INFECTION Worst symptom: started last night Fever: no Cough: yes Shortness of breath: no Wheezing: no Chest pain: no Chest tightness: no Chest congestion: no Nasal congestion: yes Runny nose: yes Post nasal drip: yes Sneezing: no Sore throat: yes Swollen glands: no Sinus pressure: yes Headache: yes Face pain: no Toothache: no Ear pain: no bilateral Ear pressure: no bilateral Eyes red/itching:no Eye drainage/crusting: no  Vomiting: no Rash: no Fatigue: no Sick contacts: no Strep contacts: no  Context: worse Recurrent sinusitis: no Relief with OTC cold/cough medications: no  Treatments attempted: none   Relevant past medical, surgical, family and social history reviewed and updated as indicated. Interim medical history since our last visit reviewed. Allergies and medications reviewed and updated.  Review of Systems  Constitutional:  Negative for fatigue and fever.  HENT:  Positive for congestion, postnasal drip, rhinorrhea, sinus pressure, sinus pain and sore throat. Negative for dental problem, ear pain and sneezing.   Respiratory:  Positive for cough. Negative for shortness of breath and wheezing.   Cardiovascular:  Negative for chest pain.  Gastrointestinal:  Negative for vomiting.  Skin:  Negative for rash.  Neurological:  Positive for headaches.    Per HPI unless specifically indicated above     Objective:    BP 108/73   Pulse 72   Temp 98.3 F (36.8 C) (Oral)   Wt 166 lb 9.6 oz (75.6 kg)   LMP 03/15/2024 (Approximate)   SpO2 98%   BMI 30.47 kg/m    Wt Readings from Last 3 Encounters:  04/15/24 166 lb 9.6 oz (75.6 kg)  03/01/22 157 lb (71.2 kg)  02/28/22 157 lb 6.4 oz (71.4 kg)    Physical Exam Vitals and nursing note reviewed.  Constitutional:      General: She is not in acute distress.    Appearance: Normal appearance. She is normal weight. She is not ill-appearing, toxic-appearing or diaphoretic.  HENT:     Head: Normocephalic.     Right Ear: External ear normal. A middle ear effusion is present.     Left Ear: External ear normal. A middle ear effusion is present.     Nose: Nose normal.     Mouth/Throat:     Mouth: Mucous membranes are moist.     Pharynx: Oropharynx is clear. Posterior oropharyngeal erythema present. No oropharyngeal exudate.  Eyes:     General:        Right eye: No discharge.        Left eye: No discharge.     Extraocular Movements: Extraocular movements intact.     Conjunctiva/sclera: Conjunctivae normal.     Pupils: Pupils are equal, round, and reactive to light.  Cardiovascular:     Rate and Rhythm: Normal rate and regular rhythm.     Heart sounds: No murmur heard. Pulmonary:     Effort: Pulmonary effort is normal. No respiratory distress.     Breath sounds: Normal breath sounds. No wheezing  or rales.  Musculoskeletal:     Cervical back: Normal range of motion and neck supple.  Skin:    General: Skin is warm and dry.     Capillary Refill: Capillary refill takes less than 2 seconds.  Neurological:     General: No focal deficit present.     Mental Status: She is alert and oriented to person, place, and time. Mental status is at baseline.  Psychiatric:        Mood and Affect: Mood normal.        Behavior: Behavior normal.        Thought Content: Thought content normal.        Judgment: Judgment normal.     Results for orders placed or performed during the hospital encounter of 03/01/22  Urinalysis, Routine w reflex microscopic Urine, Clean Catch   Collection Time: 03/01/22 10:13 AM  Result  Value Ref Range   Color, Urine YELLOW (A) YELLOW   APPearance CLEAR (A) CLEAR   Specific Gravity, Urine 1.010 1.005 - 1.030   pH 5.0 5.0 - 8.0   Glucose, UA NEGATIVE NEGATIVE mg/dL   Hgb urine dipstick NEGATIVE NEGATIVE   Bilirubin Urine NEGATIVE NEGATIVE   Ketones, ur NEGATIVE NEGATIVE mg/dL   Protein, ur NEGATIVE NEGATIVE mg/dL   Nitrite NEGATIVE NEGATIVE   Leukocytes,Ua SMALL (A) NEGATIVE   RBC / HPF 0-5 0 - 5 RBC/hpf   WBC, UA 11-20 0 - 5 WBC/hpf   Bacteria, UA RARE (A) NONE SEEN   Squamous Epithelial / HPF 0-5 0 - 5   Mucus PRESENT   Basic metabolic panel   Collection Time: 03/01/22 10:18 AM  Result Value Ref Range   Sodium 139 135 - 145 mmol/L   Potassium 4.1 3.5 - 5.1 mmol/L   Chloride 108 98 - 111 mmol/L   CO2 23 22 - 32 mmol/L   Glucose, Bld 100 (H) 70 - 99 mg/dL   BUN 20 6 - 20 mg/dL   Creatinine, Ser 8.31 (H) 0.44 - 1.00 mg/dL   Calcium 9.3 8.9 - 89.6 mg/dL   GFR, Estimated 44 (L) >60 mL/min   Anion gap 8 5 - 15  CBC   Collection Time: 03/01/22 10:18 AM  Result Value Ref Range   WBC 10.4 4.0 - 10.5 K/uL   RBC 4.91 3.87 - 5.11 MIL/uL   Hemoglobin 12.4 12.0 - 15.0 g/dL   HCT 59.9 63.9 - 53.9 %   MCV 81.5 80.0 - 100.0 fL   MCH 25.3 (L) 26.0 - 34.0 pg   MCHC 31.0 30.0 - 36.0 g/dL   RDW 87.1 88.4 - 84.4 %   Platelets 442 (H) 150 - 400 K/uL   nRBC 0.0 0.0 - 0.2 %  Lipase, blood   Collection Time: 03/01/22 10:18 AM  Result Value Ref Range   Lipase 30 11 - 51 U/L      Assessment & Plan:   Problem List Items Addressed This Visit   None Visit Diagnoses       Viral upper respiratory tract infection    -  Primary   Strep test negative.  Likely viral in nature.  Recommend OTC symptom management.  Stay well hydrated and rest.  Follow up if not improved.     Sore throat       Relevant Orders   Rapid Strep screen([facility])        Follow up plan: No follow-ups on file.

## 2024-04-18 LAB — RAPID STREP SCREEN (MED CTR MEBANE ONLY): Strep Gp A Ag, IA W/Reflex: NEGATIVE

## 2024-04-18 LAB — CULTURE, GROUP A STREP

## 2024-05-22 ENCOUNTER — Emergency Department (HOSPITAL_BASED_OUTPATIENT_CLINIC_OR_DEPARTMENT_OTHER): Admission: EM | Admit: 2024-05-22 | Discharge: 2024-05-22 | Disposition: A | Discharge status: Home / Self Care

## 2024-05-22 ENCOUNTER — Emergency Department (HOSPITAL_BASED_OUTPATIENT_CLINIC_OR_DEPARTMENT_OTHER)

## 2024-05-22 ENCOUNTER — Encounter (HOSPITAL_BASED_OUTPATIENT_CLINIC_OR_DEPARTMENT_OTHER): Payer: Self-pay | Admitting: Emergency Medicine

## 2024-05-22 ENCOUNTER — Other Ambulatory Visit: Payer: Self-pay

## 2024-05-22 DIAGNOSIS — R079 Chest pain, unspecified: Secondary | ICD-10-CM | POA: Diagnosis present

## 2024-05-22 LAB — COMPREHENSIVE METABOLIC PANEL WITH GFR
ALT: 11 U/L (ref 0–44)
AST: 23 U/L (ref 15–41)
Albumin: 4.4 g/dL (ref 3.5–5.0)
Alkaline Phosphatase: 60 U/L (ref 38–126)
Anion gap: 11 (ref 5–15)
BUN: 14 mg/dL (ref 6–20)
CO2: 21 mmol/L — ABNORMAL LOW (ref 22–32)
Calcium: 9.2 mg/dL (ref 8.9–10.3)
Chloride: 106 mmol/L (ref 98–111)
Creatinine, Ser: 0.93 mg/dL (ref 0.44–1.00)
GFR, Estimated: >60 mL/min (ref >60)
Glucose, Bld: 96 mg/dL (ref 70–99)
Potassium: 4 mmol/L (ref 3.5–5.1)
Sodium: 138 mmol/L (ref 135–145)
Total Bilirubin: 0.3 mg/dL (ref 0.0–1.2)
Total Protein: 7.2 g/dL (ref 6.5–8.1)

## 2024-05-22 LAB — CBC WITH DIFFERENTIAL/PLATELET
Abs Immature Granulocytes: 0.05 K/uL (ref 0.00–0.07)
Basophils Absolute: 0 K/uL (ref 0.0–0.1)
Basophils Relative: 0 %
Eosinophils Absolute: 0.2 K/uL (ref 0.0–0.5)
Eosinophils Relative: 1 %
HCT: 35.8 % — ABNORMAL LOW (ref 36.0–46.0)
Hemoglobin: 11.6 g/dL — ABNORMAL LOW (ref 12.0–15.0)
Immature Granulocytes: 0 %
Lymphocytes Relative: 16 %
Lymphs Abs: 1.8 K/uL (ref 0.7–4.0)
MCH: 26.2 pg (ref 26.0–34.0)
MCHC: 32.4 g/dL (ref 30.0–36.0)
MCV: 80.8 fL (ref 80.0–100.0)
Monocytes Absolute: 0.9 K/uL (ref 0.1–1.0)
Monocytes Relative: 8 %
Neutro Abs: 8.4 K/uL — ABNORMAL HIGH (ref 1.7–7.7)
Neutrophils Relative %: 75 %
Platelets: 406 K/uL — ABNORMAL HIGH (ref 150–400)
RBC: 4.43 MIL/uL (ref 3.87–5.11)
RDW: 13.8 % (ref 11.5–15.5)
WBC: 11.4 K/uL — ABNORMAL HIGH (ref 4.0–10.5)
nRBC: 0 % (ref 0.0–0.2)

## 2024-05-22 LAB — TROPONIN T, HIGH SENSITIVITY: Troponin T High Sensitivity: <15 ng/L (ref 0–19)

## 2024-05-22 MED ORDER — ALUM & MAG HYDROXIDE-SIMETH 200-200-20 MG/5ML PO SUSP
30.0000 mL | Freq: Once | ORAL | Status: AC
  Administered 2024-05-22: 30 mL via ORAL
  Filled 2024-05-22: qty 30

## 2024-05-22 MED ORDER — ACETAMINOPHEN 500 MG PO TABS
1000.0000 mg | ORAL_TABLET | Freq: Once | ORAL | Status: AC
  Administered 2024-05-22: 1000 mg via ORAL
  Filled 2024-05-22: qty 2

## 2024-05-22 MED ORDER — PANTOPRAZOLE SODIUM 20 MG PO TBEC: 20.0000 mg | DELAYED_RELEASE_TABLET | Freq: Every day | ORAL | 0 refills | Status: DC

## 2024-05-22 MED ORDER — FAMOTIDINE 20 MG PO TABS
20.0000 mg | ORAL_TABLET | Freq: Once | ORAL | Status: AC
  Administered 2024-05-22: 20 mg via ORAL
  Filled 2024-05-22: qty 1

## 2024-05-22 NOTE — ED Notes (Signed)

## 2024-05-22 NOTE — ED Provider Notes (Cosign Needed Addendum)
 [facility] Provider Note   CSN: [id number] Arrival date & time: 05/22/24  1118     Patient presents with: Chest Pain  HPI Kristie Morrow is a 23 y.o. female presenting for chest pain.  Started 2 days ago.  It is in the center of the chest and she does feel it at times in her back.  The pain is constant and initially felt like heartburn.  Denies shortness of breath.  Denies OCP use, recent long trips or immobilization and history of blood clots.    Chest Pain      Prior to Admission medications   Medication Sig Start Date End Date Taking? Authorizing Provider  pantoprazole  (PROTONIX ) 20 MG tablet Take 1 tablet (20 mg total) by mouth daily. 05/22/24 06/21/24 Yes Lielle Vandervort K, PA-C  albuterol (VENTOLIN HFA) 108 (90 Base) MCG/ACT inhaler Inhale 1-2 puffs into the lungs every 6 (six) hours as needed for wheezing or shortness of breath.  03/05/21  [provider]  montelukast (SINGULAIR) 5 MG chewable tablet Chew 5 mg by mouth at bedtime.  03/05/21  [provider]    Allergies: Patient has no known allergies.    Review of Systems  Cardiovascular:  Positive for chest pain.    Updated Vital Signs BP 128/85   Pulse 83   Temp 98.5 F (36.9 C)   Resp 17   Ht 5' 3 (1.6 m)   Wt 74.8 kg   SpO2 100%   BMI 29.23 kg/m   Physical Exam Vitals and nursing note reviewed.  HENT:     Head: Normocephalic and atraumatic.     Mouth/Throat:     Mouth: Mucous membranes are moist.  Eyes:     General:        Right eye: No discharge.        Left eye: No discharge.     Conjunctiva/sclera: Conjunctivae normal.  Cardiovascular:     Rate and Rhythm: Normal rate and regular rhythm.     Pulses: Normal pulses.          Radial pulses are 2+ on the right side and 2+ on the left side.       Dorsalis pedis pulses are 2+ on the right side and 2+ on the left side.     Heart sounds: Normal heart sounds.  Pulmonary:     Effort:  Pulmonary effort is normal.     Breath sounds: Normal breath sounds.  Abdominal:     General: Abdomen is flat.     Palpations: Abdomen is soft.  Skin:    General: Skin is warm and dry.  Neurological:     General: No focal deficit present.  Psychiatric:        Mood and Affect: Mood normal.     (all labs ordered are listed, but only abnormal results are displayed) Labs Reviewed  CBC WITH DIFFERENTIAL/PLATELET - Abnormal; Notable for the following components:      Result Value   WBC 11.4 (*)    Hemoglobin 11.6 (*)    HCT 35.8 (*)    Platelets 406 (*)    Neutro Abs 8.4 (*)    All other components within normal limits  COMPREHENSIVE METABOLIC PANEL WITH GFR - Abnormal; Notable for the following components:   CO2 21 (*)    All other components within normal limits  PREGNANCY, URINE  TROPONIN T, HIGH SENSITIVITY    EKG: None  Radiology: DG Chest 1  View Result Date: 05/22/2024 CLINICAL DATA:  Chest pain. EXAM: CHEST  1 VIEW COMPARISON:  02/25/2023 FINDINGS: The lungs are clear without focal pneumonia, edema, pneumothorax or pleural effusion. The cardiopericardial silhouette is within normal limits for size. No acute bony abnormality. IMPRESSION: No active disease. Electronically Signed   By: Camellia Candle M.D.   On: 05/22/2024 13:21     Procedures   Medications Ordered in the ED  famotidine  (PEPCID ) tablet 20 mg (20 mg Oral Given 05/22/24 1213)  alum & mag hydroxide-simeth (MAALOX/MYLANTA) 200-200-20 MG/5ML suspension 30 mL (30 mLs Oral Given 05/22/24 1213)  acetaminophen  (TYLENOL ) tablet 1,000 mg (1,000 mg Oral Given 05/22/24 1213)                                    Medical Decision Making Amount and/or Complexity of Data Reviewed Labs: ordered. Radiology: ordered.  Risk OTC drugs. Prescription drug management.   23 year old well-appearing female presenting for chest pain.  Exam was unremarkable.  EKG revealed normal sinus rhythm.  Troponin is negative.  ACS  unlikely.  Considered PE but unlikely as well given nonpleuritic chest pain and lack of risk factors and she is PERC negative.  This is likely reflux.  On reassessment symptoms had improved after GI cocktail and Tylenol .  Sent Protonix  to her pharmacy.  Advised her to follow-up with her PCP.  Discussed return precautions.  Discharged good condition.  Also considered dissection but unlikely given symmetric pulses, improved chest pain after GI cocktail and normal vitals.     Final diagnoses:  Chest pain, unspecified type    ED Discharge Orders          Ordered    pantoprazole  (PROTONIX ) 20 MG tablet  Daily        05/22/24 1518               Lang Norleen POUR, PA-C 05/22/24 1519    Lang Norleen POUR, PA-C 05/22/24 1522    Neysa Caron PARAS, DO 05/23/24 [phone]

## 2024-05-22 NOTE — Discharge Instructions (Addendum)
 Evaluation today was overall reassuring.  As we discussed, feel this is likely reflux.  I sent Protonix  to your pharmacy.  Please take that daily and follow-up with your PCP.  If your symptoms worsen or change in any way please return to the ED for further evaluation.

## 2024-05-22 NOTE — ED Notes (Signed)
 Pt has not been taking a daily med in the past, only OTC tylenol . Also advised pain is centralized to her chest and radiates to her back.

## 2024-05-22 NOTE — ED Notes (Signed)
 Took Omeprazole  for GERD for hx of same. 2 days ago post meal she would have a flare up. Now it's constant.

## 2024-05-22 NOTE — ED Triage Notes (Signed)
 Pt c/o middle CP x 2d with radiation to back; sts pain is constant, nothing increases or decreases it; felt like heartburn at first; was seen at [facility] and referred here; had EKG and CXR there

## 2024-08-03 ENCOUNTER — Ambulatory Visit: Admitting: Nurse Practitioner

## 2024-08-03 NOTE — Progress Notes (Deleted)
   There were no vitals taken for this visit.   Subjective:    Patient ID: Kristie Morrow, female    DOB: 06/14/2001, 23 y.o.   MRN: [id number]  HPI: Kristie Morrow is a 23 y.o. female  No chief complaint on file.   Relevant past medical, surgical, family and social history reviewed and updated as indicated. Interim medical history since our last visit reviewed. Allergies and medications reviewed and updated.  Review of Systems  Per HPI unless specifically indicated above     Objective:    There were no vitals taken for this visit.  Wt Readings from Last 3 Encounters:  05/22/24 165 lb (74.8 kg)  04/15/24 166 lb 9.6 oz (75.6 kg)  03/01/22 157 lb (71.2 kg)    Physical Exam  Results for orders placed or performed during the hospital encounter of 05/22/24  CBC with Differential   Collection Time: 05/22/24 12:13 PM  Result Value Ref Range   WBC 11.4 (H) 4.0 - 10.5 K/uL   RBC 4.43 3.87 - 5.11 MIL/uL   Hemoglobin 11.6 (L) 12.0 - 15.0 g/dL   HCT 64.1 (L) 63.9 - 53.9 %   MCV 80.8 80.0 - 100.0 fL   MCH 26.2 26.0 - 34.0 pg   MCHC 32.4 30.0 - 36.0 g/dL   RDW 86.1 88.4 - 84.4 %   Platelets 406 (H) 150 - 400 K/uL   nRBC 0.0 0.0 - 0.2 %   Neutrophils Relative % 75 %   Neutro Abs 8.4 (H) 1.7 - 7.7 K/uL   Lymphocytes Relative 16 %   Lymphs Abs 1.8 0.7 - 4.0 K/uL   Monocytes Relative 8 %   Monocytes Absolute 0.9 0.1 - 1.0 K/uL   Eosinophils Relative 1 %   Eosinophils Absolute 0.2 0.0 - 0.5 K/uL   Basophils Relative 0 %   Basophils Absolute 0.0 0.0 - 0.1 K/uL   Immature Granulocytes 0 %   Abs Immature Granulocytes 0.05 0.00 - 0.07 K/uL  Comprehensive metabolic panel   Collection Time: 05/22/24 12:13 PM  Result Value Ref Range   Sodium 138 135 - 145 mmol/L   Potassium 4.0 3.5 - 5.1 mmol/L   Chloride 106 98 - 111 mmol/L   CO2 21 (L) 22 - 32 mmol/L   Glucose, Bld 96 70 - 99 mg/dL   BUN 14 6 - 20 mg/dL   Creatinine, Ser 9.06 0.44 - 1.00 mg/dL   Calcium 9.2 8.9 - 89.6  mg/dL   Total Protein 7.2 6.5 - 8.1 g/dL   Albumin 4.4 3.5 - 5.0 g/dL   AST 23 15 - 41 U/L   ALT 11 0 - 44 U/L   Alkaline Phosphatase 60 38 - 126 U/L   Total Bilirubin 0.3 0.0 - 1.2 mg/dL   GFR, Estimated >39 >39 mL/min   Anion gap 11 5 - 15  Troponin T, High Sensitivity   Collection Time: 05/22/24 12:13 PM  Result Value Ref Range   Troponin T High Sensitivity <15 0 - 19 ng/L      Assessment & Plan:   Problem List Items Addressed This Visit   None    Follow up plan: No follow-ups on file.

## 2024-08-13 ENCOUNTER — Emergency Department

## 2024-08-13 ENCOUNTER — Other Ambulatory Visit: Payer: Self-pay

## 2024-08-13 ENCOUNTER — Emergency Department
Admission: EM | Admit: 2024-08-13 | Discharge: 2024-08-13 | Disposition: A | Discharge status: Home / Self Care | Attending: Emergency Medicine | Admitting: Emergency Medicine

## 2024-08-13 DIAGNOSIS — R079 Chest pain, unspecified: Secondary | ICD-10-CM

## 2024-08-13 DIAGNOSIS — R519 Headache, unspecified: Secondary | ICD-10-CM | POA: Insufficient documentation

## 2024-08-13 DIAGNOSIS — R0789 Other chest pain: Secondary | ICD-10-CM | POA: Insufficient documentation

## 2024-08-13 DIAGNOSIS — R42 Dizziness and giddiness: Secondary | ICD-10-CM | POA: Insufficient documentation

## 2024-08-13 DIAGNOSIS — M79602 Pain in left arm: Secondary | ICD-10-CM

## 2024-08-13 LAB — CBC WITH DIFFERENTIAL/PLATELET
Abs Immature Granulocytes: 0.1 K/uL — ABNORMAL HIGH (ref 0.00–0.07)
Basophils Absolute: 0.1 K/uL (ref 0.0–0.1)
Basophils Relative: 1 %
Eosinophils Absolute: 0.2 K/uL (ref 0.0–0.5)
Eosinophils Relative: 2 %
HCT: 36 % (ref 36.0–46.0)
Hemoglobin: 11.4 g/dL — ABNORMAL LOW (ref 12.0–15.0)
Immature Granulocytes: 1 %
Lymphocytes Relative: 24 %
Lymphs Abs: 3.1 K/uL (ref 0.7–4.0)
MCH: 26.1 pg (ref 26.0–34.0)
MCHC: 31.7 g/dL (ref 30.0–36.0)
MCV: 82.4 fL (ref 80.0–100.0)
Monocytes Absolute: 1 K/uL (ref 0.1–1.0)
Monocytes Relative: 8 %
Neutro Abs: 8.6 K/uL — ABNORMAL HIGH (ref 1.7–7.7)
Neutrophils Relative %: 64 %
Platelets: 381 K/uL (ref 150–400)
RBC: 4.37 MIL/uL (ref 3.87–5.11)
RDW: 13.2 % (ref 11.5–15.5)
WBC: 13 K/uL — ABNORMAL HIGH (ref 4.0–10.5)
nRBC: 0 % (ref 0.0–0.2)

## 2024-08-13 LAB — COMPREHENSIVE METABOLIC PANEL WITH GFR
ALT: 10 U/L (ref 0–44)
AST: 21 U/L (ref 15–41)
Albumin: 4.1 g/dL (ref 3.5–5.0)
Alkaline Phosphatase: 54 U/L (ref 38–126)
Anion gap: 10 (ref 5–15)
BUN: 18 mg/dL (ref 6–20)
CO2: 22 mmol/L (ref 22–32)
Calcium: 9.2 mg/dL (ref 8.9–10.3)
Chloride: 106 mmol/L (ref 98–111)
Creatinine, Ser: 0.78 mg/dL (ref 0.44–1.00)
GFR, Estimated: >60 mL/min (ref >60)
Glucose, Bld: 95 mg/dL (ref 70–99)
Potassium: 3.7 mmol/L (ref 3.5–5.1)
Sodium: 138 mmol/L (ref 135–145)
Total Bilirubin: <0.2 mg/dL (ref 0.0–1.2)
Total Protein: 6.5 g/dL (ref 6.5–8.1)

## 2024-08-13 LAB — MAGNESIUM: Magnesium: 1.9 mg/dL (ref 1.7–2.4)

## 2024-08-13 LAB — TROPONIN T, HIGH SENSITIVITY: Troponin T High Sensitivity: <15 ng/L (ref 0–19)

## 2024-08-13 NOTE — Discharge Instructions (Signed)
 You were seen in the ER today for evaluation of your chest pain, arm pain, and dizziness.  Your testing fortunately did not show an emergency cause for this.  Please follow-up with your primary care doctor for further evaluation.  Return to the ER for new or worsening symptoms.

## 2024-08-13 NOTE — ED Triage Notes (Signed)
 Patient ambulatory to triage with complaints of headache, dizziness, and left arm pain that has been ongoing for a while. Patient states it comes and goes, feels worse after eating. Has hx of HTN but not currently on medications.

## 2024-08-13 NOTE — ED Provider Notes (Signed)
 [facility] Provider Note    Event Date/Time   First MD Initiated Contact with Patient 08/13/24 [phone]     (approximate)   History   Dizziness and Headache   HPI  Kristie Morrow is a 23 year old female presenting to the emergency department for evaluation of dizziness, chest pain, left arm pain.  Patient reports that for the last several months she has had near daily episodes of left arm pain occasionally with associated chest pain.  Describes arm pain as stiffness that she feels like she needs to move her arm around to help with.  Does additionally report lightheadedness without syncope.  Sometimes worse after eating.  No recent change, but was worried about ongoing symptoms leading her to present to the ER.     Physical Exam   Triage Vital Signs: ED Triage Vitals  Encounter Vitals Group     BP 08/13/24 0414 (!) 143/83     Girls Systolic BP Percentile --      Girls Diastolic BP Percentile --      Boys Systolic BP Percentile --      Boys Diastolic BP Percentile --      Pulse Rate 08/13/24 0414 88     Resp 08/13/24 0414 17     Temp 08/13/24 0414 98.2 F (36.8 C)     Temp Source 08/13/24 0414 Oral     SpO2 08/13/24 0414 100 %     Weight 08/13/24 0408 178 lb (80.7 kg)     Height 08/13/24 0408 5' 3 (1.6 m)     Head Circumference --      Peak Flow --      Pain Score --      Pain Loc --      Pain Education --      Exclude from Growth Chart --     Most recent vital signs: Vitals:   08/13/24 0414  BP: (!) 143/83  Pulse: 88  Resp: 17  Temp: 98.2 F (36.8 C)  SpO2: 100%     General: Awake, interactive  CV:  Good peripheral perfusion Resp:  Unlabored respirations, lungs clear to auscultation Chest wall: Nontender to palpation Abd:  Nondistended.  Neuro:  Alert and oriented, normal extraocular movements, symmetric facial movement, sensation intact over bilateral upper and lower extremities with 5 out of 5 strength.  Normal finger-to-nose  testing.. Neck:  No midline pain.  Intact radial pulses bilaterally.   ED Results / Procedures / Treatments   Labs (all labs ordered are listed, but only abnormal results are displayed) Labs Reviewed  CBC WITH DIFFERENTIAL/PLATELET - Abnormal; Notable for the following components:      Result Value   WBC 13.0 (*)    Hemoglobin 11.4 (*)    Neutro Abs 8.6 (*)    Abs Immature Granulocytes 0.10 (*)    All other components within normal limits  COMPREHENSIVE METABOLIC PANEL WITH GFR  MAGNESIUM  CBC WITH DIFFERENTIAL/PLATELET  TROPONIN T, HIGH SENSITIVITY     EKG EKG independently reviewed and interpreted by myself demonstrates:  EKG demonstrates sinus rhythm at a rate of 88, PR 144, QRS 85, QTc 425, no acute ST changes  RADIOLOGY Imaging independently reviewed and interpreted by myself demonstrates:  CXR without focal consolidation  Formal Radiology Read:  DG Chest Portable 1 View Result Date: 08/13/2024 EXAM: 1 VIEW(S) XRAY OF THE CHEST 08/13/2024 05:03:37 AM COMPARISON: Portable chest 05/22/2024. CLINICAL HISTORY: chest pain FINDINGS: LUNGS AND PLEURA: No focal pulmonary opacity.  No pleural effusion. No pneumothorax. HEART AND MEDIASTINUM: No acute abnormality of the cardiac and mediastinal silhouettes. BONES AND SOFT TISSUES: No acute osseous abnormality. IMPRESSION: 1. No acute cardiopulmonary process. Unchanged. Electronically signed by: Francis Quam MD 08/13/2024 05:16 AM EST RP Workstation: [id number]    PROCEDURES:  Critical Care performed: No  Procedures   MEDICATIONS ORDERED IN ED: Medications - No data to display   IMPRESSION / MDM / ASSESSMENT AND PLAN / ED COURSE  I reviewed the triage vital signs and the nursing notes.  Differential diagnosis includes, but is not limited to ACS, pneumonia, pneumothorax, low risk PE and PERC negative, musculoskeletal strain, stress-mediated physiologic response, no evidence of focal neurologic deficit, no evidence of  vascular abnormality, anemia, electrolyte abnormality  Patient's presentation is most consistent with acute presentation with potential threat to life or bodily function.  Presents with chest pain and arm pain ongoing for several months. Labs, EKG, CXR reassuring. Negative troponin and greater than 3 hours of symptoms. Low risk HEART score. Low suspicion emergent process.  Unclear exact etiology of symptoms, but do think patient is stable for discharge with outpatient follow-up for further evaluation.  Patient comfortable this plan.  Strict return precautions provided.     FINAL CLINICAL IMPRESSION(S) / ED DIAGNOSES   Final diagnoses:  Nonspecific chest pain  Left arm pain  Dizziness     Rx / DC Orders   ED Discharge Orders     None        Note:  This document was prepared using Dragon voice recognition software and may include unintentional dictation errors.   Levander Slate, MD 08/13/24 [phone]

## 2024-09-19 ENCOUNTER — Telehealth: Admitting: Physician Assistant

## 2024-09-19 DIAGNOSIS — R200 Anesthesia of skin: Secondary | ICD-10-CM

## 2024-09-19 NOTE — Progress Notes (Signed)
 " Virtual Visit Consent   Kristie Morrow, you are scheduled for a virtual visit with a [place] provider today. Just as with appointments in the office, your consent must be obtained to participate. Your consent will be active for this visit and any virtual visit you may have with one of our providers in the next 365 days. If you have a MyChart account, a copy of this consent can be sent to you electronically.  As this is a virtual visit, video technology does not allow for your provider to perform a traditional examination. This may limit your provider's ability to fully assess your condition. If your provider identifies any concerns that need to be evaluated in person or the need to arrange testing (such as labs, EKG, etc.), we will make arrangements to do so. Although advances in technology are sophisticated, we cannot ensure that it will always work on either your end or our end. If the connection with a video visit is poor, the visit may have to be switched to a telephone visit. With either a video or telephone visit, we are not always able to ensure that we have a secure connection.  By engaging in this virtual visit, you consent to the provision of healthcare and authorize for your insurance to be billed (if applicable) for the services provided during this visit. Depending on your insurance coverage, you may receive a charge related to this service.  I need to obtain your verbal consent now. Are you willing to proceed with your visit today? Kristie Morrow has provided verbal consent on 09/19/2024 for a virtual visit (video or telephone). Kristie Morrow, [place]  Date: 09/19/2024 10:15 AM   Virtual Visit via Video Note   I, Kristie Morrow, connected with  Kristie Morrow  ([id number], 2001-05-22) on 09/19/2024 at 10:15 AM EST by a video-enabled telemedicine application and verified that I am speaking with the correct person using two identifiers.  Location: Patient: Virtual Visit Location  Patient: Home Provider: Virtual Visit Location Provider: Home Office   I discussed the limitations of evaluation and management by telemedicine and the availability of in person appointments. The patient expressed understanding and agreed to proceed.    History of Present Illness: Kristie Morrow is a 23 y.o. who identifies as a female who was assigned female at birth, and is being seen today for left arm and chest pain. States her arm is falling asleep and this happens nightly.  HPI: HPI  Problems:  Patient Active Problem List   Diagnosis Date Noted   RUQ abdominal pain 02/28/2022   Gastroesophageal reflux disease    Esophageal dysphagia    Acute esophagitis    Osgood-Schlatter's disease of right lower extremity 11/02/2018    Allergies: Allergies[1] Medications: Current Medications[2]  Observations/Objective: Patient is well-developed, well-nourished in no acute distress.  Resting comfortably  at home.  Head is normocephalic, atraumatic.  No labored breathing.  Speech is clear and coherent with logical content.  Patient is alert and oriented at baseline.    Assessment and Plan: 1. Numbness and tingling in left arm (Primary)  This patient was previously seen in the ER for the same. She has not scheduled a follow up with her PCP. Due to the nature of her symptoms and concern for other acute etiologies, I referred her back to the ER. She is not toxic or ill appearing at this time and is in agreement with this plan.   Follow Up Instructions: I discussed the assessment  and treatment plan with the patient. The patient was provided an opportunity to ask questions and all were answered. The patient agreed with the plan and demonstrated an understanding of the instructions.  A copy of instructions were sent to the patient via MyChart unless otherwise noted below.    The patient was advised to call back or seek an in-person evaluation if the symptoms worsen or if the condition fails  to improve as anticipated.    [name], PA-C     [1] No Known Allergies [2]  Current Outpatient Medications:    pantoprazole  (PROTONIX ) 20 MG tablet, Take 1 tablet (20 mg total) by mouth daily., Disp: 30 tablet, Rfl: 0  "

## 2024-09-19 NOTE — Patient Instructions (Signed)
" °  Kristie Morrow, thank you for joining [name], PA-C for today's virtual visit.  While this provider is not your primary care provider (PCP), if your PCP is located in our provider database this encounter information will be shared with them immediately following your visit.   A [facility] MyChart account gives you access to today's visit and all your visits, tests, and labs performed at [facility]  click here if you don't have a [facility] MyChart account or go to [email or web address]  Consent: (Patient) Kristie Morrow provided verbal consent for this virtual visit at the beginning of the encounter.  Current Medications:  Current Outpatient Medications:    pantoprazole  (PROTONIX ) 20 MG tablet, Take 1 tablet (20 mg total) by mouth daily., Disp: 30 tablet, Rfl: 0   Medications ordered in this encounter:  No orders of the defined types were placed in this encounter.    *If you need refills on other medications prior to your next appointment, please contact your pharmacy*  Follow-Up: Call back or seek an in-person evaluation if the symptoms worsen or if the condition fails to improve as anticipated.  [facility] [phone]  Other Instructions Report to ER for evaluation.    If you have been instructed to have an in-person evaluation today at a local Urgent Care facility, please use the link below. It will take you to a list of all of our available [facility], including address, phone number and hours of operation. Please do not delay care.  [facility]  If you or a family member do not have a primary care provider, use the link below to schedule a visit and establish care. When you choose a [facility] primary care physician or advanced practice provider, you gain a long-term partner in health. Find a Primary Care Provider  Learn more about [facility]'s in-office and virtual care options: [facility] - Get  Care Now  "

## 2024-10-07 ENCOUNTER — Ambulatory Visit: Admitting: Nurse Practitioner

## 2024-10-07 VITALS — BP 142/84 | HR 82 | Temp 98.8°F | Ht 62.99 in | Wt 180.8 lb

## 2024-10-07 DIAGNOSIS — M778 Other enthesopathies, not elsewhere classified: Secondary | ICD-10-CM

## 2024-10-07 NOTE — Progress Notes (Signed)
 "  BP (!) 142/84 (BP Location: Right Arm, Cuff Size: Normal)   Pulse 82   Temp 98.8 F (37.1 C) (Oral)   Ht 5' 2.99 (1.6 m)   Wt 180 lb 12.8 oz (82 kg)   LMP 09/17/2024 (Exact Date)   SpO2 98%   BMI 32.04 kg/m    Subjective:    Patient ID: Kristie Morrow, female    DOB: August 08, 2001, 24 y.o.   MRN: [id number]  HPI: Albertina J Neumeyer is a 24 y.o. female  Chief Complaint  Patient presents with   Arm Pain    Patient stated the left arm constantly feels like it needs to be stretched or popped. She said it gets worse when she eats, so she's been cutting back on eating sometimes. She did go to the ER and they told her it was nothing they could do and she needed to follow up with her PCP.   Patient states she has been having left arm pain, discomfort, numbness and tingling.  Feels like she constantly needs to stretch it or move it.  Feels it more in the elbow area.  Sometimes she will feel it in her hands but not always.  Denies any injury to the area. She does lift weights.  Does feel better with pressure and stretching.      Relevant past medical, surgical, family and social history reviewed and updated as indicated. Interim medical history since our last visit reviewed. Allergies and medications reviewed and updated.  Review of Systems  Musculoskeletal:        Left arm pain and discomfort    Per HPI unless specifically indicated above     Objective:    BP (!) 142/84 (BP Location: Right Arm, Cuff Size: Normal)   Pulse 82   Temp 98.8 F (37.1 C) (Oral)   Ht 5' 2.99 (1.6 m)   Wt 180 lb 12.8 oz (82 kg)   LMP 09/17/2024 (Exact Date)   SpO2 98%   BMI 32.04 kg/m   Wt Readings from Last 3 Encounters:  10/07/24 180 lb 12.8 oz (82 kg)  08/13/24 178 lb (80.7 kg)  05/22/24 165 lb (74.8 kg)    Physical Exam Vitals and nursing note reviewed.  Constitutional:      General: She is not in acute distress.    Appearance: Normal appearance. She is normal weight. She is not  ill-appearing, toxic-appearing or diaphoretic.  HENT:     Head: Normocephalic.     Right Ear: External ear normal.     Left Ear: External ear normal.     Nose: Nose normal.     Mouth/Throat:     Mouth: Mucous membranes are moist.     Pharynx: Oropharynx is clear.  Eyes:     General:        Right eye: No discharge.        Left eye: No discharge.     Extraocular Movements: Extraocular movements intact.     Conjunctiva/sclera: Conjunctivae normal.     Pupils: Pupils are equal, round, and reactive to light.  Cardiovascular:     Rate and Rhythm: Normal rate and regular rhythm.     Heart sounds: No murmur heard. Pulmonary:     Effort: Pulmonary effort is normal. No respiratory distress.     Breath sounds: Normal breath sounds. No wheezing or rales.  Musculoskeletal:     Left elbow: Normal.     Cervical back: Normal range of motion and neck supple.  Skin:    General: Skin is warm and dry.     Capillary Refill: Capillary refill takes less than 2 seconds.  Neurological:     General: No focal deficit present.     Mental Status: She is alert and oriented to person, place, and time. Mental status is at baseline.  Psychiatric:        Mood and Affect: Mood normal.        Behavior: Behavior normal.        Thought Content: Thought content normal.        Judgment: Judgment normal.     Results for orders placed or performed during the hospital encounter of 08/13/24  CBC with Differential/Platelet   Collection Time: 08/13/24  5:20 AM  Result Value Ref Range   WBC 13.0 (H) 4.0 - 10.5 K/uL   RBC 4.37 3.87 - 5.11 MIL/uL   Hemoglobin 11.4 (L) 12.0 - 15.0 g/dL   HCT 63.9 63.9 - 53.9 %   MCV 82.4 80.0 - 100.0 fL   MCH 26.1 26.0 - 34.0 pg   MCHC 31.7 30.0 - 36.0 g/dL   RDW 86.7 88.4 - 84.4 %   Platelets 381 150 - 400 K/uL   nRBC 0.0 0.0 - 0.2 %   Neutrophils Relative % 64 %   Neutro Abs 8.6 (H) 1.7 - 7.7 K/uL   Lymphocytes Relative 24 %   Lymphs Abs 3.1 0.7 - 4.0 K/uL   Monocytes  Relative 8 %   Monocytes Absolute 1.0 0.1 - 1.0 K/uL   Eosinophils Relative 2 %   Eosinophils Absolute 0.2 0.0 - 0.5 K/uL   Basophils Relative 1 %   Basophils Absolute 0.1 0.0 - 0.1 K/uL   Immature Granulocytes 1 %   Abs Immature Granulocytes 0.10 (H) 0.00 - 0.07 K/uL  Comprehensive metabolic panel with GFR   Collection Time: 08/13/24  5:20 AM  Result Value Ref Range   Sodium 138 135 - 145 mmol/L   Potassium 3.7 3.5 - 5.1 mmol/L   Chloride 106 98 - 111 mmol/L   CO2 22 22 - 32 mmol/L   Glucose, Bld 95 70 - 99 mg/dL   BUN 18 6 - 20 mg/dL   Creatinine, Ser 9.21 0.44 - 1.00 mg/dL   Calcium 9.2 8.9 - 89.6 mg/dL   Total Protein 6.5 6.5 - 8.1 g/dL   Albumin 4.1 3.5 - 5.0 g/dL   AST 21 15 - 41 U/L   ALT 10 0 - 44 U/L   Alkaline Phosphatase 54 38 - 126 U/L   Total Bilirubin <0.2 0.0 - 1.2 mg/dL   GFR, Estimated >39 >39 mL/min   Anion gap 10 5 - 15  Magnesium   Collection Time: 08/13/24  5:20 AM  Result Value Ref Range   Magnesium 1.9 1.7 - 2.4 mg/dL  Troponin T, High Sensitivity   Collection Time: 08/13/24  5:20 AM  Result Value Ref Range   Troponin T High Sensitivity <15 0 - 19 ng/L      Assessment & Plan:   Problem List Items Addressed This Visit   None Visit Diagnoses       Left elbow tendonitis    -  Primary   Referral placed for patient to do physical therapy.   Relevant Orders   Ambulatory referral to Physical Therapy        Follow up plan: Return in about 1 month (around 11/07/2024) for Physical and Fasting labs.      "

## 2024-10-22 ENCOUNTER — Ambulatory Visit: Admitting: Nurse Practitioner

## 2024-11-08 ENCOUNTER — Encounter: Admitting: Nurse Practitioner
